# Patient Record
Sex: Male | Born: 1980 | Hispanic: No | Marital: Single | Smoking: Current every day smoker
Health system: Southern US, Community
[De-identification: ages and names within clinical notes are randomized; demographics above are authoritative.]

## PROBLEM LIST (undated history)

## (undated) DIAGNOSIS — E119 Type 2 diabetes mellitus without complications: Secondary | ICD-10-CM

## (undated) DIAGNOSIS — F329 Major depressive disorder, single episode, unspecified: Secondary | ICD-10-CM

## (undated) DIAGNOSIS — M542 Cervicalgia: Secondary | ICD-10-CM

## (undated) DIAGNOSIS — F250 Schizoaffective disorder, bipolar type: Secondary | ICD-10-CM

## (undated) DIAGNOSIS — J449 Chronic obstructive pulmonary disease, unspecified: Secondary | ICD-10-CM

## (undated) DIAGNOSIS — B182 Chronic viral hepatitis C: Secondary | ICD-10-CM

## (undated) DIAGNOSIS — F17200 Nicotine dependence, unspecified, uncomplicated: Secondary | ICD-10-CM

## (undated) DIAGNOSIS — F319 Bipolar disorder, unspecified: Secondary | ICD-10-CM

## (undated) DIAGNOSIS — E785 Hyperlipidemia, unspecified: Secondary | ICD-10-CM

## (undated) DIAGNOSIS — G4730 Sleep apnea, unspecified: Secondary | ICD-10-CM

## (undated) DIAGNOSIS — F259 Schizoaffective disorder, unspecified: Secondary | ICD-10-CM

## (undated) DIAGNOSIS — F32A Depression, unspecified: Secondary | ICD-10-CM

## (undated) DIAGNOSIS — F419 Anxiety disorder, unspecified: Secondary | ICD-10-CM

## (undated) DIAGNOSIS — I517 Cardiomegaly: Secondary | ICD-10-CM

## (undated) HISTORY — DX: Type 2 diabetes mellitus without complications: E11.9

## (undated) HISTORY — DX: Cardiomegaly: I51.7

## (undated) HISTORY — DX: Anxiety disorder, unspecified: F41.9

## (undated) HISTORY — DX: Cervicalgia: M54.2

## (undated) HISTORY — DX: Nicotine dependence, unspecified, uncomplicated: F17.200

## (undated) HISTORY — DX: Sleep apnea, unspecified: G47.30

## (undated) HISTORY — PX: OTHER SURGICAL HISTORY: SHX169

## (undated) HISTORY — DX: Chronic obstructive pulmonary disease, unspecified: J44.9

## (undated) HISTORY — DX: Hyperlipidemia, unspecified: E78.5

## (undated) HISTORY — DX: Chronic viral hepatitis C: B18.2

---

## 2006-07-02 ENCOUNTER — Emergency Department: Payer: Self-pay | Admitting: Emergency Medicine

## 2006-11-06 ENCOUNTER — Emergency Department: Payer: Self-pay | Admitting: Internal Medicine

## 2006-11-06 ENCOUNTER — Inpatient Hospital Stay (HOSPITAL_COMMUNITY): Admission: AD | Admit: 2006-11-06 | Discharge: 2006-11-10 | Payer: Self-pay | Admitting: Psychiatry

## 2006-11-06 ENCOUNTER — Ambulatory Visit: Payer: Self-pay | Admitting: Psychiatry

## 2006-11-17 ENCOUNTER — Ambulatory Visit: Payer: Self-pay | Admitting: Emergency Medicine

## 2007-02-19 ENCOUNTER — Emergency Department: Payer: Self-pay | Admitting: Emergency Medicine

## 2007-04-10 ENCOUNTER — Emergency Department: Payer: Self-pay | Admitting: Emergency Medicine

## 2008-07-03 ENCOUNTER — Inpatient Hospital Stay: Payer: Self-pay | Admitting: Unknown Physician Specialty
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
## 2011-02-15 LAB — URINALYSIS, ROUTINE W REFLEX MICROSCOPIC
Bilirubin Urine: NEGATIVE
Glucose, UA: NEGATIVE
Hgb urine dipstick: NEGATIVE
Protein, ur: NEGATIVE

## 2011-04-18 ENCOUNTER — Emergency Department: Payer: Self-pay | Admitting: Emergency Medicine

## 2011-06-08 LAB — URINALYSIS, COMPLETE
Bilirubin,UR: NEGATIVE
Ketone: NEGATIVE
Leukocyte Esterase: NEGATIVE
Protein: NEGATIVE
RBC,UR: <1 /HPF (ref 0–5)
WBC UR: <1 /HPF (ref 0–5)

## 2011-06-08 LAB — DRUG SCREEN, URINE
Amphetamines, Ur Screen: NEGATIVE (ref ?–1000)
MDMA (Ecstasy)Ur Screen: NEGATIVE (ref ?–500)
Opiate, Ur Screen: NEGATIVE (ref ?–300)
Phencyclidine (PCP) Ur S: NEGATIVE (ref ?–25)

## 2011-06-08 LAB — CBC
MCH: 31.3 pg (ref 26.0–34.0)
MCHC: 33.4 g/dL (ref 32.0–36.0)
Platelet: 342 10*3/uL (ref 150–440)
RBC: 5.1 10*6/uL (ref 4.40–5.90)
RDW: 13.5 % (ref 11.5–14.5)

## 2011-06-08 LAB — COMPREHENSIVE METABOLIC PANEL
Bilirubin,Total: 0.4 mg/dL (ref 0.2–1.0)
Chloride: 105 mmol/L (ref 98–107)
Co2: 26 mmol/L (ref 21–32)
Creatinine: 0.83 mg/dL (ref 0.60–1.30)
EGFR (African American): >60
Glucose: 94 mg/dL (ref 65–99)
Osmolality: 282 (ref 275–301)
Potassium: 4 mmol/L (ref 3.5–5.1)
SGPT (ALT): 66 U/L
Sodium: 141 mmol/L (ref 136–145)
Total Protein: 7.8 g/dL (ref 6.4–8.2)

## 2011-06-08 LAB — ETHANOL
Ethanol %: <0.003 % (ref 0.000–0.080)
Ethanol: <3 mg/dL

## 2011-06-09 ENCOUNTER — Inpatient Hospital Stay: Payer: Self-pay | Admitting: Psychiatry

## 2011-07-12 ENCOUNTER — Inpatient Hospital Stay: Payer: Self-pay | Admitting: Psychiatry

## 2011-07-12 LAB — CBC
Platelet: 287 10*3/uL (ref 150–440)
RBC: 5.15 10*6/uL (ref 4.40–5.90)
WBC: 14.6 10*3/uL — ABNORMAL HIGH (ref 3.8–10.6)

## 2011-07-12 LAB — ACETAMINOPHEN LEVEL: Acetaminophen: <2 ug/mL

## 2011-07-12 LAB — COMPREHENSIVE METABOLIC PANEL
Alkaline Phosphatase: 65 U/L (ref 50–136)
Bilirubin,Total: 0.4 mg/dL (ref 0.2–1.0)
Co2: 25 mmol/L (ref 21–32)
Creatinine: 0.77 mg/dL (ref 0.60–1.30)
EGFR (Non-African Amer.): >60
SGPT (ALT): 75 U/L

## 2011-07-12 LAB — DRUG SCREEN, URINE
Cannabinoid 50 Ng, Ur ~~LOC~~: NEGATIVE (ref ?–50)
MDMA (Ecstasy)Ur Screen: NEGATIVE (ref ?–500)
Phencyclidine (PCP) Ur S: NEGATIVE (ref ?–25)

## 2011-07-13 LAB — BEHAVIORAL MEDICINE 1 PANEL
Albumin: 4 g/dL (ref 3.4–5.0)
Alkaline Phosphatase: 61 U/L (ref 50–136)
Anion Gap: 9 (ref 7–16)
Basophil #: 0 10*3/uL (ref 0.0–0.1)
Basophil %: 0.3 %
Calcium, Total: 9.4 mg/dL (ref 8.5–10.1)
Co2: 26 mmol/L (ref 21–32)
Eosinophil #: 0.3 10*3/uL (ref 0.0–0.7)
Eosinophil %: 1.9 %
Glucose: 133 mg/dL — ABNORMAL HIGH (ref 65–99)
HGB: 16.3 g/dL (ref 13.0–18.0)
Lymphocyte #: 4.8 10*3/uL — ABNORMAL HIGH (ref 1.0–3.6)
MCH: 31.6 pg (ref 26.0–34.0)
MCV: 93 fL (ref 80–100)
Monocyte #: 1 10*3/uL — ABNORMAL HIGH (ref 0.0–0.7)
Neutrophil %: 58.8 %
Osmolality: 286 (ref 275–301)
RBC: 5.14 10*6/uL (ref 4.40–5.90)
SGOT(AST): 34 U/L (ref 15–37)
Thyroid Stimulating Horm: 1.82 u[IU]/mL

## 2011-07-13 LAB — URINALYSIS, COMPLETE
Bilirubin,UR: NEGATIVE
Ketone: NEGATIVE
Nitrite: NEGATIVE
Ph: 5 (ref 4.5–8.0)
Protein: NEGATIVE
WBC UR: <1 /HPF (ref 0–5)

## 2011-07-14 LAB — CBC WITH DIFFERENTIAL/PLATELET
Basophil %: 0.4 %
HCT: 45.7 % (ref 40.0–52.0)
Lymphocyte %: 34.2 %
Monocyte %: 6.8 %
Neutrophil #: 6.9 10*3/uL — ABNORMAL HIGH (ref 1.4–6.5)
RBC: 4.89 10*6/uL (ref 4.40–5.90)

## 2011-11-02 LAB — URINALYSIS, COMPLETE
Bilirubin,UR: NEGATIVE
Blood: NEGATIVE
Ketone: NEGATIVE
Ph: 5 (ref 4.5–8.0)
Protein: 30

## 2011-11-02 LAB — COMPREHENSIVE METABOLIC PANEL
BUN: 18 mg/dL (ref 7–18)
Bilirubin,Total: 0.3 mg/dL (ref 0.2–1.0)
Calcium, Total: 9.1 mg/dL (ref 8.5–10.1)
Chloride: 108 mmol/L — ABNORMAL HIGH (ref 98–107)
EGFR (Non-African Amer.): >60
Osmolality: 286 (ref 275–301)
Potassium: 3.8 mmol/L (ref 3.5–5.1)
SGOT(AST): 23 U/L (ref 15–37)
SGPT (ALT): 50 U/L

## 2011-11-02 LAB — CBC
HCT: 44.4 % (ref 40.0–52.0)
HGB: 14.2 g/dL (ref 13.0–18.0)
MCH: 29.8 pg (ref 26.0–34.0)
MCHC: 31.9 g/dL — ABNORMAL LOW (ref 32.0–36.0)
MCV: 93 fL (ref 80–100)
RBC: 4.75 10*6/uL (ref 4.40–5.90)
WBC: 13.1 10*3/uL — ABNORMAL HIGH (ref 3.8–10.6)

## 2011-11-02 LAB — DRUG SCREEN, URINE
Benzodiazepine, Ur Scrn: NEGATIVE (ref ?–200)
Cannabinoid 50 Ng, Ur ~~LOC~~: POSITIVE (ref ?–50)
Methadone, Ur Screen: NEGATIVE (ref ?–300)
Phencyclidine (PCP) Ur S: NEGATIVE (ref ?–25)

## 2011-11-02 LAB — TSH: Thyroid Stimulating Horm: 0.793 u[IU]/mL

## 2011-11-03 ENCOUNTER — Inpatient Hospital Stay: Payer: Self-pay | Admitting: Psychiatry

## 2011-11-04 LAB — LIPID PANEL
HDL Cholesterol: 34 mg/dL — ABNORMAL LOW (ref 40–60)
Ldl Cholesterol, Calc: 91 mg/dL (ref 0–100)
VLDL Cholesterol, Calc: 50 mg/dL — ABNORMAL HIGH (ref 5–40)

## 2011-11-14 LAB — COMPREHENSIVE METABOLIC PANEL
Anion Gap: 11 (ref 7–16)
BUN: 11 mg/dL (ref 7–18)
Calcium, Total: 9 mg/dL (ref 8.5–10.1)
Chloride: 103 mmol/L (ref 98–107)
EGFR (African American): >60
Potassium: 4.3 mmol/L (ref 3.5–5.1)
SGOT(AST): 25 U/L (ref 15–37)
Total Protein: 6.9 g/dL (ref 6.4–8.2)

## 2011-11-19 LAB — CBC WITH DIFFERENTIAL/PLATELET
Basophil %: 0.4 %
Eosinophil #: 0.3 10*3/uL (ref 0.0–0.7)
Eosinophil %: 2.3 %
HGB: 15.1 g/dL (ref 13.0–18.0)
Lymphocyte #: 3.1 10*3/uL (ref 1.0–3.6)
MCH: 30.3 pg (ref 26.0–34.0)
MCHC: 32 g/dL (ref 32.0–36.0)
MCV: 95 fL (ref 80–100)
Monocyte #: 1 x10 3/mm (ref 0.2–1.0)
Platelet: 279 10*3/uL (ref 150–440)
RBC: 5 10*6/uL (ref 4.40–5.90)
WBC: 12.3 10*3/uL — ABNORMAL HIGH (ref 3.8–10.6)

## 2011-11-22 LAB — WBC: WBC: 10.9 10*3/uL — ABNORMAL HIGH (ref 3.8–10.6)

## 2012-08-16 ENCOUNTER — Emergency Department: Payer: Self-pay | Admitting: Emergency Medicine

## 2012-08-16 LAB — URINALYSIS, COMPLETE
Glucose,UR: NEGATIVE mg/dL (ref 0–75)
Leukocyte Esterase: NEGATIVE
Ph: 5 (ref 4.5–8.0)
Protein: NEGATIVE
RBC,UR: 1 /HPF (ref 0–5)
Specific Gravity: 1.019 (ref 1.003–1.030)
WBC UR: 3 /HPF (ref 0–5)

## 2012-08-16 LAB — DRUG SCREEN, URINE
Amphetamines, Ur Screen: NEGATIVE (ref ?–1000)
Barbiturates, Ur Screen: NEGATIVE (ref ?–200)
Benzodiazepine, Ur Scrn: NEGATIVE (ref ?–200)
Cannabinoid 50 Ng, Ur ~~LOC~~: NEGATIVE (ref ?–50)
Methadone, Ur Screen: NEGATIVE (ref ?–300)
Opiate, Ur Screen: NEGATIVE (ref ?–300)
Phencyclidine (PCP) Ur S: NEGATIVE (ref ?–25)

## 2012-08-16 LAB — COMPREHENSIVE METABOLIC PANEL
Bilirubin,Total: 0.3 mg/dL (ref 0.2–1.0)
Chloride: 107 mmol/L (ref 98–107)
Co2: 29 mmol/L (ref 21–32)
Creatinine: 1.05 mg/dL (ref 0.60–1.30)
EGFR (Non-African Amer.): >60
Osmolality: 281 (ref 275–301)
SGOT(AST): 62 U/L — ABNORMAL HIGH (ref 15–37)
SGPT (ALT): 117 U/L — ABNORMAL HIGH (ref 12–78)
Total Protein: 7.5 g/dL (ref 6.4–8.2)

## 2012-08-16 LAB — ETHANOL: Ethanol: <3 mg/dL

## 2012-08-16 LAB — CBC
HCT: 46.6 % (ref 40.0–52.0)
HGB: 15.7 g/dL (ref 13.0–18.0)
MCHC: 33.6 g/dL (ref 32.0–36.0)
RBC: 5.05 10*6/uL (ref 4.40–5.90)

## 2012-08-16 LAB — LITHIUM LEVEL: Lithium: <0.2 mmol/L — ABNORMAL LOW

## 2013-09-16 ENCOUNTER — Emergency Department: Payer: Self-pay | Admitting: Emergency Medicine

## 2013-09-16 LAB — ETHANOL: Ethanol: <3 mg/dL

## 2013-09-16 LAB — COMPREHENSIVE METABOLIC PANEL
ALT: 355 U/L — AB (ref 12–78)
ANION GAP: 6 — AB (ref 7–16)
AST: 131 U/L — AB (ref 15–37)
Albumin: 4.1 g/dL (ref 3.4–5.0)
Alkaline Phosphatase: 58 U/L
BILIRUBIN TOTAL: 0.4 mg/dL (ref 0.2–1.0)
BUN: 15 mg/dL (ref 7–18)
CALCIUM: 9 mg/dL (ref 8.5–10.1)
CO2: 24 mmol/L (ref 21–32)
Chloride: 107 mmol/L (ref 98–107)
Creatinine: 1.18 mg/dL (ref 0.60–1.30)
EGFR (African American): >60
EGFR (Non-African Amer.): >60
GLUCOSE: 234 mg/dL — AB (ref 65–99)
OSMOLALITY: 282 (ref 275–301)
POTASSIUM: 4.1 mmol/L (ref 3.5–5.1)
SODIUM: 137 mmol/L (ref 136–145)
TOTAL PROTEIN: 7.4 g/dL (ref 6.4–8.2)

## 2013-09-16 LAB — DRUG SCREEN, URINE
Amphetamines, Ur Screen: NEGATIVE (ref ?–1000)
BARBITURATES, UR SCREEN: NEGATIVE (ref ?–200)
BENZODIAZEPINE, UR SCRN: POSITIVE (ref ?–200)
Cannabinoid 50 Ng, Ur ~~LOC~~: NEGATIVE (ref ?–50)
Cocaine Metabolite,Ur ~~LOC~~: NEGATIVE (ref ?–300)
MDMA (ECSTASY) UR SCREEN: NEGATIVE (ref ?–500)
Methadone, Ur Screen: NEGATIVE (ref ?–300)
Opiate, Ur Screen: NEGATIVE (ref ?–300)
PHENCYCLIDINE (PCP) UR S: NEGATIVE (ref ?–25)
Tricyclic, Ur Screen: POSITIVE (ref ?–1000)

## 2013-09-16 LAB — URINALYSIS, COMPLETE
BACTERIA: NONE SEEN
BILIRUBIN, UR: NEGATIVE
Blood: NEGATIVE
Glucose,UR: 150 mg/dL (ref 0–75)
Ketone: NEGATIVE
Leukocyte Esterase: NEGATIVE
Nitrite: NEGATIVE
Ph: 6 (ref 4.5–8.0)
Protein: NEGATIVE
RBC, UR: NONE SEEN /HPF (ref 0–5)
Specific Gravity: 1.012 (ref 1.003–1.030)
Squamous Epithelial: <1
WBC UR: NONE SEEN /HPF (ref 0–5)

## 2013-09-16 LAB — LITHIUM LEVEL: Lithium: 0.61 mmol/L

## 2013-09-16 LAB — CBC
HCT: 48.7 % (ref 40.0–52.0)
HGB: 16 g/dL (ref 13.0–18.0)
MCH: 31.4 pg (ref 26.0–34.0)
MCHC: 32.9 g/dL (ref 32.0–36.0)
MCV: 96 fL (ref 80–100)
Platelet: 258 10*3/uL (ref 150–440)
RBC: 5.1 10*6/uL (ref 4.40–5.90)
RDW: 13.5 % (ref 11.5–14.5)
WBC: 13.7 10*3/uL — ABNORMAL HIGH (ref 3.8–10.6)

## 2013-09-16 LAB — SALICYLATE LEVEL: Salicylates, Serum: 3.4 mg/dL — ABNORMAL HIGH

## 2013-09-16 LAB — ACETAMINOPHEN LEVEL

## 2013-12-12 ENCOUNTER — Emergency Department: Payer: Self-pay | Admitting: Emergency Medicine

## 2013-12-12 LAB — CBC
HCT: 47.9 % (ref 40.0–52.0)
HGB: 15.5 g/dL (ref 13.0–18.0)
MCH: 31.1 pg (ref 26.0–34.0)
MCHC: 32.4 g/dL (ref 32.0–36.0)
MCV: 96 fL (ref 80–100)
Platelet: 278 10*3/uL (ref 150–440)
RBC: 4.99 10*6/uL (ref 4.40–5.90)
RDW: 13.7 % (ref 11.5–14.5)
WBC: 12.3 10*3/uL — AB (ref 3.8–10.6)

## 2013-12-12 LAB — COMPREHENSIVE METABOLIC PANEL
ALBUMIN: 3.7 g/dL (ref 3.4–5.0)
ALK PHOS: 58 U/L
ALT: 344 U/L — AB
Anion Gap: 5 — ABNORMAL LOW (ref 7–16)
BUN: 14 mg/dL (ref 7–18)
Bilirubin,Total: 0.5 mg/dL (ref 0.2–1.0)
CALCIUM: 8.7 mg/dL (ref 8.5–10.1)
Chloride: 110 mmol/L — ABNORMAL HIGH (ref 98–107)
Co2: 24 mmol/L (ref 21–32)
Creatinine: 1.01 mg/dL (ref 0.60–1.30)
EGFR (African American): >60
GLUCOSE: 129 mg/dL — AB (ref 65–99)
Osmolality: 280 (ref 275–301)
Potassium: 4.1 mmol/L (ref 3.5–5.1)
SGOT(AST): 116 U/L — ABNORMAL HIGH (ref 15–37)
Sodium: 139 mmol/L (ref 136–145)
Total Protein: 7.5 g/dL (ref 6.4–8.2)

## 2013-12-12 LAB — DRUG SCREEN, URINE
Amphetamines, Ur Screen: NEGATIVE (ref ?–1000)
BARBITURATES, UR SCREEN: NEGATIVE (ref ?–200)
BENZODIAZEPINE, UR SCRN: POSITIVE (ref ?–200)
Cannabinoid 50 Ng, Ur ~~LOC~~: NEGATIVE (ref ?–50)
Cocaine Metabolite,Ur ~~LOC~~: NEGATIVE (ref ?–300)
MDMA (ECSTASY) UR SCREEN: NEGATIVE (ref ?–500)
METHADONE, UR SCREEN: NEGATIVE (ref ?–300)
Opiate, Ur Screen: NEGATIVE (ref ?–300)
PHENCYCLIDINE (PCP) UR S: NEGATIVE (ref ?–25)
Tricyclic, Ur Screen: NEGATIVE (ref ?–1000)

## 2013-12-12 LAB — ETHANOL
Ethanol %: <0.003 % (ref 0.000–0.080)
Ethanol: <3 mg/dL

## 2013-12-12 LAB — TSH: THYROID STIMULATING HORM: 0.65 u[IU]/mL

## 2013-12-12 LAB — SALICYLATE LEVEL: SALICYLATES, SERUM: 3.2 mg/dL — AB

## 2013-12-12 LAB — ACETAMINOPHEN LEVEL: Acetaminophen: <2 ug/mL

## 2013-12-12 LAB — LITHIUM LEVEL: LITHIUM: 0.48 mmol/L — AB

## 2014-05-01 ENCOUNTER — Emergency Department: Payer: Self-pay | Admitting: Emergency Medicine

## 2014-05-01 LAB — CBC
HCT: 44.7 % (ref 40.0–52.0)
HGB: 14.9 g/dL (ref 13.0–18.0)
MCH: 31.2 pg (ref 26.0–34.0)
MCHC: 33.3 g/dL (ref 32.0–36.0)
MCV: 94 fL (ref 80–100)
Platelet: 287 10*3/uL (ref 150–440)
RBC: 4.77 10*6/uL (ref 4.40–5.90)
RDW: 13.7 % (ref 11.5–14.5)
WBC: 15 10*3/uL — AB (ref 3.8–10.6)

## 2014-05-01 LAB — COMPREHENSIVE METABOLIC PANEL
ALBUMIN: 4 g/dL (ref 3.4–5.0)
ALK PHOS: 61 U/L
AST: 93 U/L — AB (ref 15–37)
Anion Gap: 7 (ref 7–16)
BUN: 14 mg/dL (ref 7–18)
Bilirubin,Total: 0.3 mg/dL (ref 0.2–1.0)
CO2: 26 mmol/L (ref 21–32)
CREATININE: 1.08 mg/dL (ref 0.60–1.30)
Calcium, Total: 9 mg/dL (ref 8.5–10.1)
Chloride: 110 mmol/L — ABNORMAL HIGH (ref 98–107)
EGFR (Non-African Amer.): >60
Glucose: 153 mg/dL — ABNORMAL HIGH (ref 65–99)
Osmolality: 288 (ref 275–301)
Potassium: 3.9 mmol/L (ref 3.5–5.1)
SGPT (ALT): 230 U/L — ABNORMAL HIGH
SODIUM: 143 mmol/L (ref 136–145)
TOTAL PROTEIN: 7.2 g/dL (ref 6.4–8.2)

## 2014-05-01 LAB — LITHIUM LEVEL: Lithium: 0.4 mmol/L — ABNORMAL LOW

## 2014-05-01 LAB — ETHANOL: Ethanol: <3 mg/dL

## 2014-05-01 LAB — SALICYLATE LEVEL: Salicylates, Serum: 2.3 mg/dL

## 2014-05-01 LAB — ACETAMINOPHEN LEVEL: Acetaminophen: <2 ug/mL

## 2014-05-02 LAB — URINALYSIS, COMPLETE
BILIRUBIN, UR: NEGATIVE
BLOOD: NEGATIVE
Bacteria: NONE SEEN
Glucose,UR: NEGATIVE mg/dL (ref 0–75)
Ketone: NEGATIVE
LEUKOCYTE ESTERASE: NEGATIVE
Nitrite: NEGATIVE
Ph: 7 (ref 4.5–8.0)
Protein: NEGATIVE
RBC,UR: NONE SEEN /HPF (ref 0–5)
SQUAMOUS EPITHELIAL: NONE SEEN
Specific Gravity: 1.014 (ref 1.003–1.030)
WBC UR: <1 /HPF (ref 0–5)

## 2014-05-02 LAB — DRUG SCREEN, URINE
Amphetamines, Ur Screen: NEGATIVE (ref ?–1000)
Barbiturates, Ur Screen: NEGATIVE (ref ?–200)
Benzodiazepine, Ur Scrn: POSITIVE (ref ?–200)
CANNABINOID 50 NG, UR ~~LOC~~: NEGATIVE (ref ?–50)
Cocaine Metabolite,Ur ~~LOC~~: NEGATIVE (ref ?–300)
MDMA (ECSTASY) UR SCREEN: NEGATIVE (ref ?–500)
Methadone, Ur Screen: NEGATIVE (ref ?–300)
OPIATE, UR SCREEN: NEGATIVE (ref ?–300)
PHENCYCLIDINE (PCP) UR S: NEGATIVE (ref ?–25)
Tricyclic, Ur Screen: NEGATIVE (ref ?–1000)

## 2014-08-19 NOTE — Consult Note (Signed)
Psychological Assessment  Roy MulliganJoel Chandler31of Evaluation: 7-30-12Administered: [name] Gestalt  Trail Making Test Parts A & B for Referral: [name] was referred for a psychological assessment by his physician, Roy SectionAarti Kapur, MD.  He was admitted to Behavioral Medicine for the treatment paranoid and delusional thoughts with auditory hallucinations.  He has been diagnosed with paranoid schizophrenia in the past. Please see the history and physical and psychosocial history for further background information. A neuro-psychological screening was requested.  [name] was pleasant and cooperative with the testing process. He attempted all tests requested of him. He appeared to try his best. The present evaluation is considered a valid indication of current functioning. of TestingMaking Test: On Part A [name] required 50 seconds to complete the task. Expected performance is 27 to 39 seconds. His performance reflects mild to moderate impairment. On Part B he required 120 seconds. Expected performance is 66 to 85 seconds. His performance reflects mild to moderate impairment. Gestalt: [name] obtained two errors on his drawings for the Bender Gestalt ? perseveration and angulation. His performance falls in the expected range for adults.  Impression: [name] showed more difficulty on timed than untimed tests. His performance suggests no evidence to mild evidence for brain impairment.    Electronic Signatures: [name], [name] (PsyD, HSP-P)  (Signed on 30-Jul-13 10:09)  Authored  Last Updated: 30-Jul-13 10:09 by [name], [name] (PsyD, HSP-P)

## 2014-08-20 LAB — URINALYSIS, COMPLETE
BILIRUBIN, UR: NEGATIVE
Bacteria: NONE SEEN
Blood: NEGATIVE
Glucose,UR: NEGATIVE mg/dL (ref 0–75)
KETONE: NEGATIVE
LEUKOCYTE ESTERASE: NEGATIVE
NITRITE: NEGATIVE
PROTEIN: NEGATIVE
Ph: 6 (ref 4.5–8.0)
SPECIFIC GRAVITY: 1.012 (ref 1.003–1.030)
Squamous Epithelial: NONE SEEN

## 2014-08-20 LAB — COMPREHENSIVE METABOLIC PANEL
ALBUMIN: 4.6 g/dL
ALT: 152 U/L — AB
ANION GAP: 4 — AB (ref 7–16)
Alkaline Phosphatase: 56 U/L
BILIRUBIN TOTAL: 0.4 mg/dL
BUN: 22 mg/dL — ABNORMAL HIGH
CHLORIDE: 107 mmol/L
Calcium, Total: 9.4 mg/dL
Co2: 30 mmol/L
Creatinine: 0.95 mg/dL
EGFR (African American): >60
EGFR (Non-African Amer.): >60
GLUCOSE: 110 mg/dL — AB
Potassium: 4.5 mmol/L
SGOT(AST): 67 U/L — ABNORMAL HIGH
Sodium: 141 mmol/L
TOTAL PROTEIN: 7.3 g/dL

## 2014-08-20 LAB — DRUG SCREEN, URINE
Amphetamines, Ur Screen: NEGATIVE
BARBITURATES, UR SCREEN: NEGATIVE
BENZODIAZEPINE, UR SCRN: POSITIVE
CANNABINOID 50 NG, UR ~~LOC~~: NEGATIVE
Cocaine Metabolite,Ur ~~LOC~~: NEGATIVE
MDMA (Ecstasy)Ur Screen: NEGATIVE
METHADONE, UR SCREEN: NEGATIVE
OPIATE, UR SCREEN: NEGATIVE
PHENCYCLIDINE (PCP) UR S: NEGATIVE
Tricyclic, Ur Screen: POSITIVE

## 2014-08-20 LAB — CBC
HCT: 44.5 % (ref 40.0–52.0)
HGB: 14.6 g/dL (ref 13.0–18.0)
MCH: 30.5 pg (ref 26.0–34.0)
MCHC: 32.8 g/dL (ref 32.0–36.0)
MCV: 93 fL (ref 80–100)
PLATELETS: 251 10*3/uL (ref 150–440)
RBC: 4.79 10*6/uL (ref 4.40–5.90)
RDW: 14.3 % (ref 11.5–14.5)
WBC: 15.7 10*3/uL — AB (ref 3.8–10.6)

## 2014-08-20 LAB — LITHIUM LEVEL: LITHIUM: 0.99 mmol/L (ref 0.60–1.20)

## 2014-08-20 LAB — ETHANOL: Ethanol: <5 mg/dL

## 2014-08-20 LAB — ACETAMINOPHEN LEVEL: Acetaminophen: <10 ug/mL

## 2014-08-20 LAB — SALICYLATE LEVEL: Salicylates, Serum: <4 mg/dL

## 2014-08-21 LAB — DIFFERENTIAL
BASOS ABS: 0.1 10*3/uL (ref 0.0–0.1)
BASOS PCT: 0.7 %
EOS PCT: 4.5 %
Eosinophil #: 0.7 10*3/uL (ref 0.0–0.7)
LYMPHS PCT: 16.1 %
Lymphocyte #: 2.5 10*3/uL (ref 1.0–3.6)
Monocyte #: 1 x10 3/mm (ref 0.2–1.0)
Monocyte %: 6.2 %
NEUTROS ABS: 11.4 10*3/uL — AB (ref 1.4–6.5)
Neutrophil %: 72.5 %

## 2014-08-22 ENCOUNTER — Inpatient Hospital Stay
Admission: AD | Admit: 2014-08-22 | Discharge: 2014-09-08 | DRG: 885 | Disposition: A | Discharge status: Home / Self Care | Payer: Medicaid Other | Attending: Psychiatry | Admitting: Psychiatry

## 2014-08-22 DIAGNOSIS — B1920 Unspecified viral hepatitis C without hepatic coma: Secondary | ICD-10-CM | POA: Diagnosis present

## 2014-08-22 DIAGNOSIS — R000 Tachycardia, unspecified: Secondary | ICD-10-CM | POA: Diagnosis present

## 2014-08-22 DIAGNOSIS — Z9889 Other specified postprocedural states: Secondary | ICD-10-CM | POA: Diagnosis not present

## 2014-08-22 DIAGNOSIS — Z6830 Body mass index (BMI) 30.0-30.9, adult: Secondary | ICD-10-CM | POA: Diagnosis not present

## 2014-08-22 DIAGNOSIS — F1221 Cannabis dependence, in remission: Secondary | ICD-10-CM | POA: Diagnosis present

## 2014-08-22 DIAGNOSIS — I10 Essential (primary) hypertension: Secondary | ICD-10-CM | POA: Diagnosis present

## 2014-08-22 DIAGNOSIS — F22 Delusional disorders: Secondary | ICD-10-CM | POA: Diagnosis present

## 2014-08-22 DIAGNOSIS — E669 Obesity, unspecified: Secondary | ICD-10-CM | POA: Diagnosis present

## 2014-08-22 DIAGNOSIS — F17200 Nicotine dependence, unspecified, uncomplicated: Secondary | ICD-10-CM | POA: Diagnosis present

## 2014-08-22 DIAGNOSIS — F250 Schizoaffective disorder, bipolar type: Secondary | ICD-10-CM | POA: Diagnosis not present

## 2014-08-22 DIAGNOSIS — J449 Chronic obstructive pulmonary disease, unspecified: Secondary | ICD-10-CM | POA: Diagnosis present

## 2014-08-22 DIAGNOSIS — K5900 Constipation, unspecified: Secondary | ICD-10-CM | POA: Diagnosis present

## 2014-08-22 NOTE — Consult Note (Signed)
Brief Consult Note: Diagnosis: Schizophrenia, Paranoid Type.   Patient was seen by consultant.   Recommend further assessment or treatment.   Orders entered.   Comments: Pt seen in ED BHU. He remains agitated and uncooperative. He stated that he does not need Haldol and does not want to go another facility. He has just received the Abilify Maintena injec tion and has not shown any improvement. He has no insight into his illness.Remains a risk to himself and others.   Plan:  Will titrate  Haldol 10 mg po BID and Cogentin 1 mg po BID  Continue Lithium 300mg  po qam and 600mg  po qhs.  Change Lorazepam 0.5 po qhs.  [facility].  Electronic Signatures: Rhunette CroftFaheem, Welles Walthall S (MD)  (Signed 26-Apr-14 10:58)  Authored: Brief Consult Note   Last Updated: 26-Apr-14 10:58 by Rhunette CroftFaheem, Rylan Bernard S (MD)

## 2014-08-22 NOTE — Consult Note (Signed)
Brief Consult Note: Diagnosis: Schizophrenia, Paranoid Type.   Patient was seen by consultant.   Recommend further assessment or treatment.   Orders entered.   Comments: Pt seen in ED BHU. He remains very agitated and uncooperative. Stated that he has "money" and can find his own place and live on his own. He was not willing to provide information about his medications. His MAR indicated that he is on Abilify Maintenna 300mg  IM qmonthly, but is not known when he received the last injection. Remains uncooperative with the staff as well.   Plan:  Will add Haldol 5mg  po BID Start Lithium 300mg  po qam and 600mg  po qhs.  Change Lorazepam 0.5 po qhs.  Will obtain Collateral info about his group home and medications.  Electronic Signatures: Rhunette CroftFaheem, Maximillion Gill S (MD)  (Signed 22-Apr-14 10:49)  Authored: Brief Consult Note   Last Updated: 22-Apr-14 10:49 by Rhunette CroftFaheem, Holger Sokolowski S (MD)
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
## 2014-08-22 NOTE — Consult Note (Signed)
Brief Consult Note: Diagnosis: Schizophrenia, Paranoid Type.   Patient was seen by consultant.   Recommend further assessment or treatment.   Orders entered.   Comments: Pt seen in ED BHU. He appeared more calm and cooperative and has started showing on his medications. He stated that he is willing to go back to the Group Home, where he came from as he does not have any acute issues now. he stated that he is complaint with meds and he was given Abilify Maintenna in the Ed BHU. He was given the injection here and will get the next dose at his outpt appointment with Dr Janeece RiggersSu.  Pt denied Si/HI or plans.   Plan:  Continue  Haldol 10 mg po BID and Cogentin 1 mg po BID  Continue Lithium 300mg  po qam and 600mg  po qhs.  Change Lorazepam 0.5 po qhs.  Will release him from IVC  D/C to Group Home in stable condition.  Electronic Signatures: Rhunette CroftFaheem, Alexias Margerum S (MD)  (Signed 29-Apr-14 10:19)  Authored: Brief Consult Note   Last Updated: 29-Apr-14 10:19 by Rhunette CroftFaheem, Maelee Hoot S (MD)
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
## 2014-08-23 NOTE — Consult Note (Signed)
PATIENT NAME:  [name], [name] MR#:  [id number] DATE OF BIRTH:  [birth year]  DATE OF CONSULTATION:  12/13/2013  REFERRING PHYSICIAN:   CONSULTING PHYSICIAN:  Audery AmelJohn T. Clapacs, MD  IDENTIFYING INFORMATION AND REASON FOR CONSULT: This is a 34 year old man with a history of schizophrenia, brought in voluntarily from his group home. His chief complaint to me was "Apolinar JunesBrandon would not let me use the phone."   HISTORY OF PRESENT ILLNESS: Information obtained from the patient and the chart. The patient was brought here by his group home last night. He told the intake nurse that he needed to be on Ritalin. He complained of having his hallucinations, which he said had been going on for years. There was some vague secondhand statement that he had made a comment about hurting himself. On interview today, the patient tells me that he was being driven to [facility] for his shot yesterday when he tried to get his group home leader to give him the telephone. When the group home leader would not do it, apparently there was some kind of argument and he was brought to the hospital. There is no indication that he has actually engaged in any acutely dangerous behavior. The patient says he has been compliant with his medicines. He says that he has auditory hallucinations about 75% of the time and that they have been chronic. Not necessarily better or worse than always. Mood is okay. No suicidal or homicidal ideation. No acute substance abuse.   PAST PSYCHIATRIC HISTORY: The patient has schizophrenia with symptoms that have been present for about 8 years or so. He has had several hospitalizations in the past. He also has a history of substance abuse. He had finally gotten fairly stable on medication and is living in a group home. I do not believe there is an actual history of any suicide attempts. He has been hostile and aggressive at times when he was psychotic.   PAST MEDICAL HISTORY: COPD, apparently. Otherwise, no significant ongoing  medical problems.   FAMILY HISTORY: None.   SOCIAL HISTORY: According to the patient, he is his own guardian. He is living in a group home. Gets disability. 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   CURRENT MEDICATIONS: Clorazepate 7.5 mg twice a day, Cogentin 0.5 mg twice a day, Concerta 27 mg once a morning, Spiriva 18 mcg inhaled once a day, Lodine 400 mg twice a day, Flexeril 10 mg twice a day, Qvar inhaler 2 puffs twice a day, Abilify Maintena injection 300 mg once a month, Ativan 1 mg at night, lithium 450 mg at night, ProAir inhaler 2 puffs 4 times a day.   ALLERGIES: NO KNOWN DRUG ALLERGIES.   REVIEW OF SYSTEMS: Auditory hallucinations. Mood is stable. No suicidal or homicidal ideation. No other physical complaints. Generally negative review of systems.   LABORATORY RESULTS: He was tachycardic, but otherwise, his EKG was normal. Lithium level 0.48. Drug screen positive for benzodiazepines. TSH normal. Salicylates unremarkable. CBC slightly elevated. White count of 12.3. Alcohol negative. Chemistry panel: No significant abnormalities except for an ALT elevated at 344. AST elevated at 116.  I am not sure what that is from.   PHYSICAL EXAMINATION: Full physical not done. The patient does not appear to be in any acute distress. Can move all his extremities. Gait is normal. Face is normal.  No skin lesions identified.  Temperature 98.2, pulse 98, respirations 20, blood pressure 126/69.   MENTAL STATUS EXAMINATION: Slightly disheveled gentleman  who looks his stated age or younger, cooperative with the interview. Eye contact good. Psychomotor activity normal. Speech normal rate, tone and volume. Affect euthymic, reactive. Mood stated as okay. Thoughts are lucid. No evidence of delusions. Reports auditory hallucinations. Seems at times to perhaps be a little distracted. No visual hallucinations. No suicidal or homicidal ideation. Alert and oriented x 4. Can recall 2 out of 3 objects at 2  minutes. Long-term memory grossly intact. Normal intelligence.   ASSESSMENT: A 34 year old man with schizophrenia, got into some kind of his squabble with his group home. No indication here of actual dangerousness. He is reporting hallucinations, but they are stable and chronic. He is lucid in his conversation. There is no indication here for hospital treatment or any change to medication.   TREATMENT PLAN: He is not under involuntary commitment. Does not require hospitalization. No need for change in medication. Medications reviewed and appear to be appropriate. The patient counseled to follow up with his outpatient psychiatrist for further treatment.   DIAGNOSIS, PRINCIPAL AND PRIMARY:  AXIS I: Schizophrenia.   SECONDARY DIAGNOSES:  AXIS I: No further.  AXIS II: No diagnosis.  AXIS III: Chronic obstructive pulmonary disease,  AXIS IV: Moderate, chronic.  AXIS V: Functioning at time of evaluation 55.    ____________________________ Audery Amel, MD jtc:TT D: 12/13/2013 14:03:09 ET T: 12/13/2013 14:30:15 ET JOB#: [id number]  cc: Audery Amel, MD, <Dictator> Audery Amel MD ELECTRONICALLY SIGNED 01/10/2014 16:56

## 2014-08-23 NOTE — Consult Note (Signed)
Brief Consult Note: Diagnosis: Schizophrenia, Paranoid Type.   Patient was seen by consultant.   Consult note dictated.   Recommend further assessment or treatment.   Comments: [name] has a h/o psychosis. He has been stable on medications prescribed by Dr. Janeece RiggersSu. His voices became louder in the pst several days in spite of medic ation compliance. He is not suicidal or homicidal.    PLAN: 1. The patient does not meet criteria for IVC. Please discharge as appropriate.  2. He is to continue all his medications as prescribed by Dr. Janeece RiggersSu.  3. Will increase Tranxene to 7.5 mg twice daily. Rx given.  4. He will follow up with Dr. Janeece RiggersSu.   5. Group home will pick him up.  Electronic Signatures: Kristine LineaPucilowska, Jolanta (MD)  (Signed 18-May-15 17:08)  Authored: Brief Consult Note   Last Updated: 18-May-15 17:08 by Kristine LineaPucilowska, Jolanta (MD)

## 2014-08-23 NOTE — Consult Note (Signed)
PATIENT NAME:  [name], [name] MR#:  [id number] DATE OF BIRTH:  1980-10-19  DATE OF ADMISSION:  09/16/2013 DATE OF CONSULTATION:  09/16/2013  REFERRING PHYSICIAN:  Dr. Jene Everyobert Kinner. CONSULTING PHYSICIAN:  Rodert Hinch B. Delvis Kau, MD  REASON FOR CONSULTATION:  To evaluate a psychotic patient.   IDENTIFYING DATA:  [name] is a 34 year old male with history of schizophrenia.   CHIEF COMPLAINT:  "My voice is amplified."   HISTORY OF PRESENT ILLNESS:  [name] has a long history of psychosis. He continues to have auditory hallucinations daily in spite of good medication compliance. He has been a patient of Dr. Janeece RiggersSu and has been taking lithium, Haldol and Abilify Maintena injections. He has been doing very well recently. He has been living in the same group home for 2 years and doing fine. He is compliant with his medications and doctor's appointments.  There are no substances involved. He goes to [facility] where he has a girlfriend. He has an interview at the vocational rehab. For the past few days, he has been complaining of hallucinations that are louder than ever. The voice of his "friend" returned with power and the voice was arguing with the patient, threatening to wake him up in the middle of the night, making him eat more chips so he will gain weight and kill him. Usually, the patient uses music as a distraction, but lately it has not been possible any longer. On the day of admission, he reports loud voice of this "friend."  He believes that he has a plate implanted in the left side of his head where he is receiving messages from [employer].  He does not appear to excessively anxious about the voices or frightened. He talks about it Medical sales representativematter-of-factly. He is able to name his medications. He is picky about medicines and there are several of them that he refuses to take. His lithium level is 0.5, and the patient believes that it is just right. He does not like it higher because it makes him shake.  He does not believe that he has schizoaffective disorder, and so does not consider lithium an important part of the treatment. He denies any symptoms of depression, anxiety. No symptoms suggestive of bipolar mania.  No substance use reported.   PAST PSYCHIATRIC HISTORY: [facility], [facility], [facility] and [facility].  He is seeing Dr. Janeece RiggersSu for outpatient psychiatry. He denies ever attempting suicide.   FAMILY PSYCHIATRIC HISTORY:  None reported.   PAST MEDICAL HISTORY:  Obesity.   ALLERGIES:  No known drug allergies.   MEDICATIONS ON ADMISSION:  Propranolol 10 mg twice daily, Flovent twice daily, Lodine 400 mg twice daily, Flexeril 10 mg twice daily, Cogentin 0.5 mg twice daily, Haldol 10 mg twice daily, Abilify Maintena 400 mg IM injection last given on May 8th, Tranxene 7.5 mg at bedtime, lithium carbonate 600 mg at bedtime, Ultram 50 mg twice daily as needed, ProAir inhaler as needed.   SOCIAL HISTORY:  The patient has been a resident of the group home for the past 2 years; he likes it there.  They take good care of him. He participates in many activities including [facility]. He also goes to [facility] for some substance use counseling even though he denies using substances. He works with [employer] [facility]. He goes to day program at [facility] daily. We contacted them. Nobody at the [facility] or his mother was aware of any additional stressors.  He is disabled and has health insurance. His parents live in the area and they are supportive. He grew up in [place] area, graduated from high school.   REVIEW OF SYSTEMS:  CONSTITUTIONAL: No fevers or chills. Positive for gradual weight gain.  EYES: No double or blurred vision.  ENT: No hearing losses.  RESPIRATORY:  No shortness of breath or cough.  CARDIOVASCULAR: No chest pain or orthopnea.  GASTROINTESTINAL: No abdominal pain, nausea, vomiting or diarrhea.   GENITOURINARY: No incontinence or frequency.  ENDOCRINE: No heat or cold intolerance.  LYMPHATIC: No anemia or easy bruising.  INTEGUMENTARY: No acne or rash.  MUSCULOSKELETAL: No muscle or joint pain.  NEUROLOGIC: No tingling or weakness.  PSYCHIATRIC: See history of present illness for details.   PHYSICAL EXAMINATION: VITAL SIGNS:  Blood pressure 132/80, pulse 110, respirations 20, temperature 98.4.  GENERAL: This is a slightly obese young male in no acute distress. The rest of the physical examination is deferred to his primary attending.   LABORATORY DATA: Chemistries are within normal limits except for blood glucose of 234. Blood alcohol level is 0. LFTs within normal limits except for AST of 131 and ALT of 355. Lithium level is 0.61. Urine tox screen is positive for benzodiazepines and tricyclic antidepressants. CBC within normal limits except for white blood count of 13.7. Urinalysis is not suggestive of urinary tract infection. Serum acetaminophen less than 2. Serum salicylates 3.4.   MENTAL STATUS EXAMINATION: The patient is alert and oriented to person, place, time and situation. He is pleasant, polite and cooperative. He is well groomed. He wears hospital scrubs. He maintains good eye contact. His speech is soft, deliberate and slow. His mood is fine  with flat affect. Thought process is logical with its own logic. Thought content: He is paranoid and delusional. He endorses auditory hallucinations. His cognition is grossly intact. Registration, recall, short and long-term memory are all intact. He is a fair historian. He is of average intelligence and fund of knowledge. His insight and judgment are fair.   DIAGNOSES: AXIS I:  Schizoaffective disorder, bipolar type.  AXIS II:  Personality disorder, not otherwise specified.  AXIS III:  Obesity,  AXIS IV:  Mental illness.  AXIS V:  Global assessment of functioning 55.   PLAN:  1.  The patient does not meet criteria for involuntary  inpatient psychiatric commitment. Please discharge as appropriate.  2.  He is to continue all medications as prescribed in the community by Dr. Janeece Riggers.  3.  We will increase the dose of Tranxene from 7.5 at bedtime to 7.5 twice daily. Prescription was given.  4.  He will follow up with Dr. Janeece Riggers.  5.  His group home will pick him up.     ____________________________ Braulio Conte B. Jennet Maduro, MD jbp:dmm D: 09/16/2013 17:37:02 ET T: 09/16/2013 19:24:47 ET JOB#: [id number]  cc: Berdina Cheever B. Jennet Maduro, MD, <Dictator> Shari Prows MD ELECTRONICALLY SIGNED 09/26/2013 7:25

## 2014-08-24 LAB — HEPATIC FUNCTION PANEL A (ARMC)
Albumin: 4.4 g/dL
Alkaline Phosphatase: 56 U/L
Bilirubin,Total: 0.7 mg/dL
SGOT(AST): 77 U/L — ABNORMAL HIGH
SGPT (ALT): 165 U/L — ABNORMAL HIGH
Total Protein: 7.4 g/dL

## 2014-08-24 LAB — CBC WITH DIFFERENTIAL/PLATELET
BASOS PCT: 0.7 %
Basophil #: 0.1 10*3/uL (ref 0.0–0.1)
Eosinophil #: 0.6 10*3/uL (ref 0.0–0.7)
Eosinophil %: 4.2 %
HCT: 44.7 % (ref 40.0–52.0)
HGB: 14.5 g/dL (ref 13.0–18.0)
LYMPHS PCT: 19 %
Lymphocyte #: 2.8 10*3/uL (ref 1.0–3.6)
MCH: 30.2 pg (ref 26.0–34.0)
MCHC: 32.5 g/dL (ref 32.0–36.0)
MCV: 93 fL (ref 80–100)
MONO ABS: 1 x10 3/mm (ref 0.2–1.0)
Monocyte %: 6.9 %
NEUTROS ABS: 10.1 10*3/uL — AB (ref 1.4–6.5)
Neutrophil %: 69.2 %
Platelet: 264 10*3/uL (ref 150–440)
RBC: 4.82 10*6/uL (ref 4.40–5.90)
RDW: 14.5 % (ref 11.5–14.5)
WBC: 14.6 10*3/uL — AB (ref 3.8–10.6)

## 2014-08-24 NOTE — Discharge Summary (Signed)
PATIENT NAME:  [name], [name] MR#:  [id number] DATE OF BIRTH:  May 28, 1980  DATE OF ADMISSION:  06/09/2011 DATE OF DISCHARGE:  06/14/2011  HOSPITAL COURSE: See dictated history and physical for details of admission. This is a 34 year old man who was brought to the hospital because of concerns about bizarre statements and behaviors and the possibility of dangerousness at home. The patient has a history of schizophrenia. Parents reported that he had had multiple odd behaviors over the last several weeks. The event that brought him specifically to the hospital was that he had claimed that he thought that the next-door neighbor was shooting at him, an event which evidently did not occur. The patient seems to have had some hallucinations and paranoia around this. He had made a comment at one point about how he was going to get a gun, although it was later confirmed that he had no access to guns and had not actually done anything aggressive or hostile. In the hospital the patient did not display any aggression or frankly bizarre behavior. He does admit to some disorganized and scattered thinking. His insight is only partial. He did show an understanding that he needed to stay on medication. Overall he seemed to have some ongoing symptoms of schizophrenia but also to have some control into the medication that he was taking. Some medication adjustments were done. He was not continued on stimulants which he had been taking outside the hospital. There was no evidence of any diagnosis that would clearly indicate stimulants and there seemed to be a possibility they made him more agitated. His Abilify dose was adjusted to 15 mg a day from his previous 10 mg a day. He tolerated this well. The patient was treated with diazepam 5 mg 3 to 4 times a day as he had been taking outside the hospital. Seroquel was used just 100 mg at night. We spoke with the family who had ongoing concerns. They had been educated about the nature of  schizophrenia but seem to still have possibly overly optimistic expectations for his recovery. The patient again was not dangerous or aggressive or threatening any time while he was in the hospital. He had outpatient follow-up arranged with Dr. Elesa MassedWard. The patient was discharged home on current medications to be followed up in the community.   DISCHARGE MEDICATIONS:  1. Aripiprazole 15 mg p.Ewing. daily.  2. Quetiapine 50 mg q.6 hours p.r.n. agitation and 100 mg at bedtime.  3. Diazepam 5 mg 3 to 4 a day. 4. Cogentin 1 mg twice a day.   LABORATORY, DIAGNOSTIC, AND RADIOLOGICAL DATA: The urinalysis was unremarkable. Drug screen was positive for cannabis and for benzodiazepines. The patient was counseled about the need to stay off of cannabis with his disease. CBC showed an elevated white count at 15. No other symptoms to go with it. Alcohol level undetectable. Chemistries unremarkable.   MENTAL STATUS EXAM AT DISCHARGE: Casually dressed, reasonably well groomed young man who looks his stated age. Good eye contact. Normal psychomotor activity. Speech normal in rate and tone. A little bit blunted. Affect a little bit constricted but not bizarrely so. Did not appear to be depressed or hostile. Mood stated as fine. Thoughts were a little bit slowed but not grossly bizarre or disorganized. Able to hold a reasonable conversation and show pretty good insight. Denied suicidal or homicidal ideation. Denied acute hallucinations or paranoia.   DISPOSITION: Discharge back to his family's home with follow-up with Dr. Elesa MassedWard.   DIAGNOSES PRINCIPLE AND  PRIMARY:  AXIS I: Schizophrenia, paranoid type.   SECONDARY DIAGNOSES:  AXIS I: Marijuana abuse.   AXIS II: No diagnosis.   AXIS III: No diagnosis.   AXIS IV: Moderate. Chronic ongoing stress from illness and conflict with family.   AXIS V: Functioning at time of discharge 55.   ____________________________ Audery Amel, MD jtc:drc D: 06/27/2011 13:00:28  ET T: 06/27/2011 13:28:59 ET JOB#: [id number]  cc: Audery Amel, MD, <Dictator> Audery Amel MD ELECTRONICALLY SIGNED 06/27/2011 13:52
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
## 2014-08-24 NOTE — Discharge Summary (Signed)
PATIENT NAME:  [name], [name] MR#:  [id number] DATE OF BIRTH:  05-Jul-1980  DATE OF ADMISSION:  07/12/2011 DATE OF DISCHARGE:  07/15/2011  HISTORY OF PRESENT ILLNESS: [name] was admitted to the Inpatient Behavioral Health Unit on 07/12/2011 with delusions that there were transmitters from the basement sending beams through his brain. He was also believing that his food was poisoned. He had been running around his house with a hammer trying to kill the auditory hallucinations. His father had to petition for his commitment.   ANCILLARY CLINICAL DATA: None.   HOSPITAL COURSE: [name] was admitted to the Inpatient Behavioral Health Unit and underwent milieu and group psychotherapy. He quickly re-stabilized after being given a regular regimen of his previously effective psychiatric medications along with milieu and group psychotherapy. He was continued on his Abilify 10 mg daily, benztropine 1 mg b.i.d. He was also receiving Seroquel 100 mg at bedtime. As an outpatient, he had been receiving Valium 5 mg q.i.d. to help with akathisia and avoid dry mouth with Cogentin. He was switched to Klonopin 1 mg b.i.d. which was more effective and resulted in less sedation. His hallucinations decreased him to their baseline level-soft in volume and easily ignored by the patient.   CONDITION ON DISCHARGE: By March 15th, [name] has normal interests and constructive future goals. He has been talking with his father on the phone with constructive conversations. He is not having any adverse medication effects. His interests are normal. Appetite is normal. Sleep is normal.   MENTAL STATUS EXAM UPON DISCHARGE: [name] is alert. His eye contact is good. Concentration is normal. He is oriented to all spheres. Memory is intact to immediate, recent, and remote. Fund of knowledge, intelligence, and use of language are normal. Speech involves normal rate and prosody without dysarthria. Thought process is logical,  coherent, and goal directed. No looseness of associations. Thought content: No thoughts of harming himself, no thoughts of harming others. No delusions. The hallucinations are very faint and intermittent. He can ignore them easily. There are no command destructive hallucinations. Insight is intact. Judgment is intact. Mood is within normal limits. Affect is slightly blunted.    DISCHARGE DIAGNOSES:  AXIS I:  1. Schizophrenia, paranoid type, chronic with acute exacerbation, now clinically stable.  2. Anxiety disorder, not otherwise specified, stable.   AXIS II: [name] has been tried on single antipsychotic therapy multiple times in the past. He has not responded adequately. One monotherapy trial was with Risperdal. He also was tried on Abilify by itself. There have been other antipsychotics as well.   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   AXIS IV: Primary support group.   AXIS V: 55.   [name] is not at risk to harm himself or others. He agrees to call Emergency Services immediately for any thoughts of harming himself, thoughts of harming others, or distress.   DIET: Regular.   ACTIVITY: Routine.   DISCHARGE MEDICATIONS:  1. Seroquel 100 mg at bedtime.  2. Abilify 15 mg at bedtime. 3. Cogentin 1 mg b.i.d.  4. Klonopin 1 mg t.i.d.   FOLLOW UP:  1. Follow up with the Donnie CoffinElla McRae at [facility] on March 19th at 11:00 a.m.  2. Follow up with Dr. Elesa MassedWard with [facility] on 08/02/2011.  ____________________________ Adelene AmasJames S. Pattricia Weiher, MD jsw:cbb D: 07/24/2011 23:27:33 ET T: 07/25/2011 10:22:35 ET JOB#: [id number]  cc: Adelene AmasJames S. Ione Sandusky, MD, <Dictator> Lester CarolinaJAMES S Cedar Roseman MD ELECTRONICALLY SIGNED 07/25/2011 19:58

## 2014-08-24 NOTE — H&P (Signed)
PATIENT NAME:  [name], [name] MR#:  [id number] DATE OF BIRTH:  [birth year]  DATE OF ADMISSION:  07/12/2011  CHIEF COMPLAINT AND IDENTIFYING DATA: [name] is a 34 year old male presenting to the Emergency Department of [facility] with severe psychosis.   He has been having delusions that there are transmitters from the basement sending beams through his brain. He also believes that his food is being poisoned. He has been running around his house with a hammer trying to kill the auditory hallucinations. His father had to contact emergency services.   [name] is oriented to all spheres. He does not have any thought disorganization or clouding of consciousness. There does not appear to be any precipitating stress. According to the history he has been taking his maintenance psychotropic medications. The duration of the above symptoms has been a progressive pattern of approximately three days prior to presentation. Please see the past medical history for his current medication regimen.  PAST MEDICAL HISTORY  HOME MEDICATIONS:  1. Seroquel 100 mg daily.  2. Valium 5 mg q.i.d.  3. Cogentin 1 mg b.i.d. 4. Seroquel 50 mg every six hours p.r.n.  5. Abilify 15 mg daily.  ALLERGIES: No known drug allergies.   MEDICAL PROBLEMS: 1. Asthma.  2. History of right patella tendon repair.   PSYCHIATRIC HISTORY: [name] does have a history of several psychotic exacerbations. There is report of an attention deficit hyperactivity disorder with treatment history including Focalin and Concerta. He carries a diagnosis of schizophrenia, paranoid type.   FAMILY PSYCHIATRIC HISTORY: None known.   SOCIAL HISTORY: Please see the above. [name] is unemployed. He is disabled. He did finish high school and was able to only achieve a small amount of college. He denies alcohol. He does occasionally smoke marijuana.   LABORATORY, DIAGNOSTIC, AND RADIOLOGICAL DATA: Urinalysis  unremarkable. SGPT normal. TSH normal. SGOT normal. BUN 21, creatinine 0.9, WBC was elevated at 14.9, hemoglobin and platelet count normal. EKG normal with QTc of 428 ms. Aspirin negative. Ethanol negative. Tylenol negative. Urine drug screen unremarkable except for benzodiazepine positive.   REVIEW OF SYSTEMS: Constitutional, HEENT, mouth, neurologic, psychiatric, cardiovascular, respiratory, gastrointestinal, genitourinary, skin, musculoskeletal, hematologic, lymphatic, endocrine, metabolic all unremarkable.   PHYSICAL EXAMINATION:  VITAL SIGNS: Temperature 98.4, pulse 93, respiratory rate 20, blood pressure 126/87.   GENERAL APPEARANCE: [name] is a young male laying in a supine position on his hospital bed with no abnormal involuntary movements. He has no cachexia. His muscle tone is normal. His grooming is normal. Hygiene is normal.   [name] is alert. His eye contact is good. He is oriented to all spheres. His memory is intact to immediate, recent, and remote. His concentration is mildly decreased. Abstraction is intact. Speech involves normal rate and prosody without dysarthria. Thought process is coherent. Thought content: Please see the history of present illness. [name] has no thoughts of harming himself or others. However, his insight and judgment are poor. His affect is anxious. Mood is anxious.   ASSESSMENT:  AXIS I:  1. Schizophrenia, paranoid type, chronic with acute exacerbation.  2. Anxiety disorder, not otherwise specified.   AXIS II: Deferred.   AXIS III: History of right patellar tendon repair. [name] states that he has a chronic pain condition due to this and he receives opioid pain medication as an outpatient. By the history it sounds like it is Percocet.   AXIS IV: Primary support group.   AXIS V: 30.  [name] current psychotic state impairs his judgment and makes him at risk for lethal self-neglect.  His condition has calmed to the point  that he is cooperative and redirectable.   PLAN:  1. Supportive milieu and group psychotherapy.  2. Will continue a steady regimen of antipsychotic therapy, Abilify 10 mg daily starting out along with Seroquel 100 mg at bedtime to start out.  We will also use 25 mg of Seroquel q.i.d. p.r.n. agitation. The conservative approach of restarting his antipsychotics is utilized given that the duration that he might have been off of them is unknown and restarting them at a higher dose could risk acute dystonia.  3. Would continue clonazepam as opposed to Valium for anti-acute anxiety at clonazepam 1 mg b.i.d.  We will have Ativan 2 mg q. 6 h. available for anti-acute agitation. We will continue Cogentin 1 mg b.i.d. for anti-EPS.  ____________________________ Adelene AmasJames S. Jaylena Holloway, MD jsw:bjt D: 07/13/2011 23:28:25 ET T: 07/14/2011 08:15:19 ET JOB#: [id number]  cc: Adelene AmasJames S. Kenichi Cassada, MD, <Dictator> Lester CarolinaJAMES S Roy Wisdom MD ELECTRONICALLY SIGNED 07/14/2011 12:23
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
## 2014-08-26 LAB — WBC: WBC: 17.7 10*3/uL — AB (ref 3.8–10.6)

## 2014-08-26 LAB — DIFFERENTIAL
BASOS PCT: 0.9 %
Basophil #: 0.2 10*3/uL — ABNORMAL HIGH (ref 0.0–0.1)
EOS ABS: 0.6 10*3/uL (ref 0.0–0.7)
Eosinophil %: 3.5 %
Lymphocyte #: 2.7 10*3/uL (ref 1.0–3.6)
Lymphocyte %: 15 %
MONO ABS: 1.5 x10 3/mm — AB (ref 0.2–1.0)
Monocyte %: 8.4 %
Neutrophil #: 12.8 10*3/uL — ABNORMAL HIGH (ref 1.4–6.5)
Neutrophil %: 72.2 %

## 2014-08-26 LAB — LITHIUM LEVEL: Lithium: 0.59 mmol/L — ABNORMAL LOW (ref 0.60–1.20)

## 2014-08-30 MED ORDER — ALUM & MAG HYDROXIDE-SIMETH 200-200-20 MG/5ML PO SUSP
30.0000 mL | ORAL | Status: DC | PRN
  Administered 2014-08-31 – 2014-09-01 (×2): 30 mL via ORAL
  Filled 2014-08-30 (×5): qty 30

## 2014-08-30 MED ORDER — METOPROLOL SUCCINATE ER 25 MG PO TB24
25.0000 mg | ORAL_TABLET | Freq: Every day | ORAL | Status: DC
  Administered 2014-08-31 – 2014-09-04 (×5): 25 mg via ORAL
  Filled 2014-08-30 (×6): qty 1

## 2014-08-30 MED ORDER — DOCUSATE SODIUM 100 MG PO CAPS
200.0000 mg | ORAL_CAPSULE | Freq: Two times a day (BID) | ORAL | Status: DC
  Administered 2014-08-31 – 2014-09-08 (×17): 200 mg via ORAL
  Filled 2014-08-30 (×18): qty 2

## 2014-08-30 MED ORDER — CLOZAPINE 25 MG PO TABS
350.0000 mg | ORAL_TABLET | Freq: Every day | ORAL | Status: DC
  Administered 2014-08-31 – 2014-09-07 (×8): 350 mg via ORAL
  Filled 2014-08-30 (×8): qty 2

## 2014-08-30 MED ORDER — LITHIUM CARBONATE ER 300 MG PO TBCR
300.0000 mg | EXTENDED_RELEASE_TABLET | Freq: Every day | ORAL | Status: DC
  Administered 2014-08-31 – 2014-09-08 (×9): 300 mg via ORAL
  Filled 2014-08-30 (×9): qty 1

## 2014-08-30 MED ORDER — CLOZAPINE 25 MG PO TABS
50.0000 mg | ORAL_TABLET | Freq: Every day | ORAL | Status: DC
  Administered 2014-08-31: 50 mg via ORAL
  Filled 2014-08-30: qty 2

## 2014-08-30 MED ORDER — FLUTICASONE PROPIONATE HFA 44 MCG/ACT IN AERO
2.0000 | INHALATION_SPRAY | Freq: Two times a day (BID) | RESPIRATORY_TRACT | Status: DC
  Administered 2014-08-31 – 2014-09-08 (×17): 2 via RESPIRATORY_TRACT

## 2014-08-30 MED ORDER — ACETAMINOPHEN 325 MG PO TABS: 650.0000 mg | ORAL_TABLET | ORAL | Status: DC | PRN

## 2014-08-30 MED ORDER — METHYLPHENIDATE HCL 5 MG PO TABS
5.0000 mg | ORAL_TABLET | ORAL | Status: DC
  Administered 2014-08-31 – 2014-09-05 (×12): 5 mg via ORAL
  Filled 2014-08-30 (×12): qty 1

## 2014-08-30 MED ORDER — SENNA 8.6 MG PO TABS
2.0000 | ORAL_TABLET | Freq: Every day | ORAL | Status: DC
  Administered 2014-08-31 – 2014-09-07 (×8): 17.2 mg via ORAL
  Filled 2014-08-30 (×9): qty 2

## 2014-08-30 MED ORDER — LITHIUM CARBONATE ER 300 MG PO TBCR
600.0000 mg | EXTENDED_RELEASE_TABLET | Freq: Every day | ORAL | Status: DC
  Administered 2014-08-31 – 2014-09-07 (×8): 600 mg via ORAL
  Filled 2014-08-30 (×10): qty 2

## 2014-08-30 MED ORDER — NICOTINE 14 MG/24HR TD PT24
14.0000 mg | MEDICATED_PATCH | Freq: Every day | TRANSDERMAL | Status: DC
  Administered 2014-08-31 – 2014-09-08 (×8): 14 mg via TRANSDERMAL
  Filled 2014-08-30 (×9): qty 1

## 2014-08-30 MED ORDER — NICOTINE 10 MG IN INHA
1.0000 | RESPIRATORY_TRACT | Status: DC | PRN
  Administered 2014-09-02: 1 via RESPIRATORY_TRACT

## 2014-08-31 DIAGNOSIS — B1920 Unspecified viral hepatitis C without hepatic coma: Secondary | ICD-10-CM | POA: Diagnosis present

## 2014-08-31 DIAGNOSIS — F250 Schizoaffective disorder, bipolar type: Secondary | ICD-10-CM | POA: Diagnosis present

## 2014-08-31 DIAGNOSIS — K5900 Constipation, unspecified: Secondary | ICD-10-CM | POA: Diagnosis present

## 2014-08-31 DIAGNOSIS — E669 Obesity, unspecified: Secondary | ICD-10-CM | POA: Diagnosis present

## 2014-08-31 DIAGNOSIS — R000 Tachycardia, unspecified: Secondary | ICD-10-CM | POA: Diagnosis present

## 2014-08-31 DIAGNOSIS — I10 Essential (primary) hypertension: Secondary | ICD-10-CM | POA: Diagnosis present

## 2014-08-31 DIAGNOSIS — J449 Chronic obstructive pulmonary disease, unspecified: Secondary | ICD-10-CM | POA: Diagnosis present

## 2014-08-31 MED ORDER — CLOZAPINE 100 MG PO TABS
100.0000 mg | ORAL_TABLET | Freq: Every day | ORAL | Status: DC
  Administered 2014-09-01: 100 mg via ORAL
  Filled 2014-08-31: qty 1

## 2014-08-31 MED ORDER — CLOZAPINE 25 MG PO TABS
25.0000 mg | ORAL_TABLET | Freq: Once | ORAL | Status: AC
  Administered 2014-08-31: 25 mg via ORAL
  Filled 2014-08-31: qty 1

## 2014-08-31 NOTE — Consult Note (Signed)
PAtient with schizophreina. No new complaints. Continues to show poor self care and responding to internal stimuli. ROS minimizes and denies everything Minimal speach. Disorganized thoughts. Flat affect. Denies SI or HI. Poor insight. now to inpatient ward, I hope. Continue medication. Precautions in place  Electronic Signatures: Shannen Flansburg, Jackquline DenmarkJohn T (MD)  (Signed on 21-Apr-16 21:29)  Authored  Last Updated: 21-Apr-16 21:29 by Audery Amellapacs, Kaitlyn Skowron T (MD)

## 2014-08-31 NOTE — [facility] Group Notes (Signed)
[facility] Group Notes:  (Nursing/MHT/Case Management/Adjunct)  Date:  08/31/2014  Time:  9:44 PM  Type of Therapy:  Group Therapy  Participation Level:  Active  Participation Quality:  Appropriate  Affect:  Appropriate  Cognitive:  Appropriate  Insight:  Appropriate  Engagement in Group:  Engaged  Modes of Intervention:  Education  Summary of Progress/Problems:  Roy MunroeClarence Melvin Zaim Ewing 08/31/2014, 9:44 PM
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
## 2014-08-31 NOTE — Progress Notes (Signed)
Pt. Denies depression, but states he is hearing voices "all the time for years." Denies HV, SI/HI.

## 2014-08-31 NOTE — Plan of Care (Signed)
Problem: Diagnosis: Increased Risk For Suicide Attempt Goal: LTG-Patient Will Show Positive Response to Medication LTG (by discharge) : Patient will show positive response to medication and will participate in the development of the discharge plan.  Outcome: Progressing Taking meds. As scheduled. Denies depression and no suicidal thoughts. Goal: LTG-Patient Will Report Improved Mood and Deny Suicidal LTG (by discharge) Patient will report improved mood and deny suicidal ideation.  Outcome: Progressing Denying suicidal ideation and depression. Goal: LTG-Patient Will Report Improvement in Psychotic Symptoms LTG (by discharge) : Patient will report improvement in psychotic symptoms.  Outcome: Not Progressing Continues to endorse auditory hallucinations. Goal: STG-Patient Will Attend All Groups On The Unit Outcome: Not Progressing Is not attending all groups. Goal: STG-Patient Will Report Suicidal Feelings to Staff Outcome: Progressing Presently denying suicidal feelings. Goal: STG-Patient Will Comply With Medication Regime Outcome: Progressing Takes meds. As ordered. Goal: STG-Patient will be able to identify reason for suicidal STG-Patient will be able to identify reason for suicidal ideation  Outcome: Progressing Verbalizes frustration that he hears voices and meds. Don't help.
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
## 2014-08-31 NOTE — H&P (Signed)
PATIENT NAME:  [name], [name] MR#:  [id number] DATE OF BIRTH:  08/21/1980  DATE OF ADMISSION:  08/22/2014  CHIEF COMPLAINT: "[name] has been 'jumping out of my window.'" The patient  indicates there is a person, [name], who through review of the paperwork was perhaps a former high school classmate. He states that he believes this individual is doing things to him,  such as shooting him up with some type of drug or chemical. He states this happened earlier this week. He states that his group home manager heard him yelling loudly and then eventually told him to get ready to come to the hospital. He states that no medications have worked for him to control these voices or his beliefs. He states the only thing that controls it is when he decides to do it. He states he has been taking his medications as prescribed. He denies being depressed. He denies any suicidal ideation. Denies any homicidal ideation.   He was noted in his group home to be doing punching and kickboxing moves in the hallway. He states that this is the only way to take care of [name].   PAST PSYCHIATRIC HISTORY: The patient states he has had 2-3 hospitalizations. The last one was 2-3 months ago at this facility. He does report prior trials of Zyprexa, Depakote and Risperdal. In regard to substances, he states he used marijuana 10 years ago. He states that he did use alcohol heavily for 1 to 2 years when he was in his early 1220s. He states his use then would be 8-12 beers every few days. He denies any past suicide attempts.   PAST MEDICAL HISTORY: He states he had a right knee patella surgery 4-6 years ago.   FAMILY HISTORY OF MENTAL ILLNESS: He states he does not know of any a diagnosed mental illness in his family.   SOCIAL HISTORY: The patient has been in a group home for the past 2-1/2 years. He is noted to get along reasonably well with others other than during periods of psychosis. He has some contact with his family.    CURRENT MEDICATIONS: Cogentin 0.5 mg twice daily, Tranxene 7.5 mg 3 times a day, Clozapine  250 mg at night, Flexeril 10 mg twice a day, Flonase inhaler 2 puffs q. 12 h with spacer, haloperidol 5 mg twice a day as needed for agitation, lithium carbonate 600 mg at night and 300 milligrams in the morning, propranolol 10 mg twice a day, tramadol 50 mg twice a day, as needed for pain, Abilify Maintena 400 mg.   ALLERGIES: No known drug allergies.   MENTAL STATUS EXAMINATION: Poorly groomed, young male, appearing his stated age of 34. He was alert and oriented x3. His speech was normal rate and volume. He was cooperative with the interview. His thought process was disorganized. His thought content was significant for delusions. There were no auditory or visual hallucinations visible during this interview; however, per history, he does have visual and tactile hallucinations. During the interview, he did touch his arm and say it was shaking and stated that it was [name] making that happened. He denies any suicidal ideation. Denies any homicidal ideation. He denies any paranoia outside of the delusion about [name]. His remote memory is intact as evident by his ability to describe past history and treatment. His immediate memory is intact as evident by his ability to discuss recent events in terms of getting him into the hospital.   PHYSICAL EXAMINATION:  VITAL SIGNS: Temperature of 98.4, pulse of 103, respirations 20, blood pressure 132/93 sitting and 134/85 standing. His gait was grossly within normal limits. Muscle strength was grossly within normal limits.   No other physical exam is indicated given evaluation in the Emergency Room.   LABORATORY RESULTS: His comprehensive metabolic panel is within normal limits except for a mildly elevated BUN at 22 and glucose elevated at 110. He has elevated ALT at 152 and AST at 67. His urine tox screen is positive for benzodiazepines. Lithium level is 0.99.  Ethanol level less than 5. CBC is white count slightly elevated at 15.7. His urinalysis is within normal limits. His acetaminophen and salicylate levels were within normal limits. His clozapine level is 364. His clozapine and norclozapine level, combined, is 450, and his norclozapine level was 86.   DIAGNOSES:  1.  Schizophrenia.  2.  Marijuana abuse, moderate, in remission.  3.  Chronic obstructive pulmonary disease, mild.   ASSESSMENT AND PLAN: The patient's clozapine level appears to be therapeutic. He is also getting Abilify Maintena; however, there is no clarity as to when he last had his injection. Per the patient, he believes he had his injection about 1 to 2 weeks ago. We will at this time try to increase his Haldol to address the delusions and agitation. We will reorder his liver function tests given the elevations noted above. We will also recheck a CBC, secondary to the elevated white count noted.     ____________________________ Loralie Champagne Mayford Knife, MD alw:je D: 08/23/2014 12:28:08 ET T: 08/23/2014 13:22:10 ET JOB#: [id number]  cc: Leory Plowman L. Mayford Knife, MD, <Dictator> Kerin Salen MD ELECTRONICALLY SIGNED 08/27/2014 13:03

## 2014-08-31 NOTE — Progress Notes (Signed)
Pt. Has been calm and cooperative today.  Interacting appropriately with staff and peers.  Medicated once for complaint of indigestion.  Appetite good.  Attending groups.

## 2014-09-01 ENCOUNTER — Encounter: Payer: Self-pay | Admitting: Psychiatry

## 2014-09-01 MED ORDER — CLOZAPINE 25 MG PO TABS
125.0000 mg | ORAL_TABLET | Freq: Every day | ORAL | Status: DC
  Administered 2014-09-02 – 2014-09-03 (×2): 125 mg via ORAL
  Filled 2014-09-01 (×2): qty 1

## 2014-09-01 MED ORDER — LORAZEPAM 2 MG PO TABS
2.0000 mg | ORAL_TABLET | Freq: Once | ORAL | Status: AC
  Administered 2014-09-01: 2 mg via ORAL

## 2014-09-01 MED ORDER — LORAZEPAM 2 MG PO TABS
ORAL_TABLET | ORAL | Status: AC
  Filled 2014-09-01: qty 1

## 2014-09-01 MED ORDER — LORAZEPAM 2 MG PO TABS
2.0000 mg | ORAL_TABLET | Freq: Four times a day (QID) | ORAL | Status: DC | PRN
Start: 2014-09-01 — End: 2014-09-08
  Administered 2014-09-01 – 2014-09-05 (×10): 2 mg via ORAL
  Filled 2014-09-01 (×10): qty 1

## 2014-09-01 MED ORDER — LORAZEPAM 2 MG/ML IJ SOLN: 2.0000 mg | Freq: Once | INTRAMUSCULAR | Status: AC

## 2014-09-01 NOTE — Plan of Care (Signed)
Problem: Consults Goal: Suicide Risk Patient Education (See Patient Education module for education specifics)  Outcome: Progressing Denies suicidal thoughts.

## 2014-09-01 NOTE — Progress Notes (Signed)
Recreation Therapy Notes  INPATIENT RECREATION THERAPY ASSESSMENT  Patient Details Name: [name] MRN: [id number] DOB: 1980/10/29 Today's Date: 09/01/2014   Assessment completed 04.25.16 at 11:42 am.  Patient Stressors:  Patient did not report any stressors.  Coping Skills:    Arguments, Avoidance, Exercise, Art/dance, Music, and Sports  Personal Challenges:  Concentration, Expressing yourself, Relationships, Self-esteem, Time management, and Trusting others  Leisure Interests (2+):   "I don't know. It's too violent right now." TV and music  Awareness of Community Resources:   Yes  Community Resources:   Bowling, [place], and Driving range  Current Use:  No  If no, Barriers?:  "I don't know"  Patient Strengths:   Tries to be indestructible, and handsome   Patient Identified Areas of Improvement:   Improve vertical by 3-4 inches  Current Recreation Participation:   Play basketball, Dance, Go to day program, Watch TV, Listen to music channels  Patient Goal for Hospitalization:   For the voices to stop  [place]idence[place]   [place]  County of Residence:   [place]   Current SI (including self-harm):   No  Current HI:    No  Consent to Intern Participation:  N/A   Jacquelynn Creelizabeth M Aurorah Schlachter, LRT/CTRS 09/01/2014, 5:28 PM

## 2014-09-01 NOTE — Progress Notes (Signed)
[name] alert,pleasant and cooperative.Mood sad,flat and depressed.Minimal interaction with peers.Med compliant.States he always has auditory hallucinations.He has had for 12 years and no meds work on his hallucinations.Will continue to monitor closely.

## 2014-09-01 NOTE — Progress Notes (Signed)
WNL

## 2014-09-01 NOTE — Progress Notes (Signed)
Recreation Therapy Group Note Template    Date: [id number] Time: 3:00 PM Location: Craft Room  Group Topic: Self-Expression  Goal Area(s) Addresses:  Patient will identify one color per emotion listed on wheel. Patient will verbalize benefit of using art as a means of self-expression. Patient will verbalize one emotion experienced during session. Patient will be educated on other forms of self-expression.  Behavioral Response: Attentive, Interactive, Disruptive/Inappropriate  Intervention: Emotion Wheel  Activity: Patients were given a worksheet with 7 different emotions. Patients were instructed to pick a color for each emotion and color the designated section.   Education: LRT educated patients on self-expression and different forms of self-expression.   Education Outcome: Acknowledges education/In group clarification offered  Clinical Observations/Feedback: Patient completed activity by picking colors for each emotion and drawing symbols for each emotion. Patient contributed to group discussion by stating what colors he picked for each emotion and why he picked those colors. Patient would get sidetracked when answering questions. LRT redirected patient and patient complied.       Jacquelynn CreeElizabeth M Greene, LRT/CTRS 09/01/2014 4:17 PM

## 2014-09-01 NOTE — Progress Notes (Signed)
Patient alert and oriented x 4. He continues to report hallucinations and exhibits delusional thinking. Resistive to any reality orientation. He does deny SI. He is minimally involved in milieu and groups. Taking medications as directed with no adverse effects. Patient anxious for discharge. Will continue to monitor mental status and safety, reality orientation as tolerated, support through discharge transition.

## 2014-09-01 NOTE — Plan of Care (Signed)
Problem: Diagnosis: Increased Risk For Suicide Attempt Goal: STG-Patient Will Comply With Medication Regime Outcome: Progressing Compliant with meds

## 2014-09-01 NOTE — Progress Notes (Signed)
Blunted affect

## 2014-09-01 NOTE — [facility] Group Notes (Signed)
Understanding Treatment PsychoEd Group Attended and participated as best he could. Focused on discharge. Had some difficulty with day of the week and time of day orientation.

## 2014-09-01 NOTE — [facility] Group Notes (Signed)
[facility] Group Notes:  (Nursing/MHT/Case Management/Adjunct)  Date:  09/01/2014  Time:  11:56 PM  Type of Therapy:  Group Therapy  Participation Level:  Active  Participation Quality:  Attentive  Affect:  Appropriate  Cognitive:  Appropriate  Insight:  Appropriate  Engagement in Group:  Off Topic  Modes of Intervention:  Education  Summary of Progress/Problems:  Ethel RanaSherrell M Avamae Dehaan 09/01/2014, 11:56 PM

## 2014-09-01 NOTE — Progress Notes (Signed)
[facility] MD Progress Note  09/01/2014 2:49 PM [name]  MRN:  [id number] Subjective:  Patient continues to report having auditory hallucinations of voices telling him he is getting injected with all sorts of medications. He continues to be delusional as he says he has been fighting with people in all his bones in his arms are broken. Patient has been very intrusive today knocking on my office door multiple times requesting help for his fractured arms.  At examination there is no evidence of any kind of trauma.  He has been seen punching the air in kicking the air since Friday.  He perseverates on being discharged back to his group home and also on getting more ritalin as he thinks with ritalin he will be able to go back to work.    He denies suicidality or homicidality he denies any side effects from his medications as far as physical complaints he complains of some sedation denies major problems with sleep appetite or concentration.  Per nursing today patient was agitated and demanding discharge nurse was unable to redirect him. Patient required Ativan 2 mg prn.  Principal Problem: Schizoaffective disorder, bipolar type Diagnosis:   Patient Active Problem List   Diagnosis Date Noted  . Schizoaffective disorder, bipolar type [F25.0] 08/31/2014  . Obesity [E66.9] 08/31/2014  . HTN (hypertension) [I10] 08/31/2014  . COPD (chronic obstructive pulmonary disease) [J44.9] 08/31/2014  . Sinus tachycardia [I47.1] 08/31/2014  . Constipation [K59.00] 08/31/2014  . Hepatitis C [B19.20] 08/31/2014   Total Time spent with patient: 30 minutes   Past Medical History: No past medical history on file.  Past Surgical History  Procedure Laterality Date  . Right knee -patela operation     Family History: No family history on file. Social History:  History  Alcohol Use No     History  Drug Use  . Yes  . Special: Marijuana    Comment: in remission    History   Social History  . Marital Status:  Single    Spouse Name: N/A  . Number of Children: N/A  . Years of Education: N/A   Social History Main Topics  . Smoking status: Current Every Day Smoker  . Smokeless tobacco: Not on file  . Alcohol Use: No  . Drug Use: Yes    Special: Marijuana     Comment: in remission  . Sexual Activity: Not on file   Other Topics Concern  . Not on file   Social History Narrative  . No narrative on file   Additional History:    Sleep: Good  Appetite:  Good   Assessment:   Musculoskeletal: Strength & Muscle Tone: within normal limits Gait & Station: normal Patient leans: N/A   Psychiatric Specialty Exam: Physical Exam   Review of Systems  Respiratory: Negative for cough.   Gastrointestinal: Negative for nausea, vomiting, abdominal pain, diarrhea and constipation.  Neurological: Negative for headaches.  Psychiatric/Behavioral: Positive for hallucinations. Negative for depression and suicidal ideas. The patient does not have insomnia.     Blood pressure 135/86, pulse 100, temperature 98.4 F (36.9 C), temperature source Oral, resp. rate 18, height 5' 7.99" (1.727 m), weight 91.2 kg (201 lb 1 oz).Body mass index is 30.58 kg/(m^2).  General Appearance: Disheveled  Eye Contact::  Good  Speech:  Clear and Coherent  Volume:  Normal  Mood:  Irritable  Affect:  Constricted  Thought Process:  Tangential  Orientation:  Full (Time, Place, and Person)  Thought Content:  Delusions and  Hallucinations: Auditory  Suicidal Thoughts:  No  Homicidal Thoughts:  No  Memory:  Immediate;   Good Recent;   Good Remote;   Good  Judgement:  Poor  Insight:  Lacking  Psychomotor Activity:  Normal  Concentration:  Fair  Recall:  Good  Fund of Knowledge:Good  Language: Good  Akathisia:  No  Handed:  Right  AIMS (if indicated):     Assets:  Financial Resources/Insurance Housing Social Support  ADL's:  Intact  Cognition: WNL  Sleep:  Number of Hours: 7     Current Medications: Current  Facility-Administered Medications  Medication Dose Route Frequency Provider Last Rate Last Dose  . acetaminophen (TYLENOL) tablet 650 mg  650 mg Oral Q4H PRN Doctor Chlconversion, MD      . alum & mag hydroxide-simeth (MAALOX/MYLANTA) 200-200-20 MG/5ML suspension 30 mL  30 mL Oral Q4H PRN Doctor Chlconversion, MD   30 mL at 08/31/14 1417  . [START ON 09/02/2014] cloZAPine (CLOZARIL) tablet 125 mg  125 mg Oral Daily Jimmy Footman, MD      . cloZAPine (CLOZARIL) tablet 350 mg  350 mg Oral QHS Doctor Chlconversion, MD   350 mg at 08/31/14 2150  . docusate sodium (COLACE) capsule 200 mg  200 mg Oral BID Doctor Chlconversion, MD   200 mg at 09/01/14 0905  . fluticasone (FLOVENT HFA) 44 MCG/ACT inhaler 2 puff  2 puff Inhalation BID Doctor Chlconversion, MD   2 puff at 09/01/14 0820  . lithium carbonate (LITHOBID) CR tablet 300 mg  300 mg Oral Daily Doctor Chlconversion, MD   300 mg at 09/01/14 0905  . lithium carbonate (LITHOBID) CR tablet 600 mg  600 mg Oral QHS Doctor Chlconversion, MD   600 mg at 08/31/14 2146  . LORazepam (ATIVAN) 2 MG tablet           . LORazepam (ATIVAN) tablet 2 mg  2 mg Oral Q6H PRN Jimmy Footman, MD      . methylphenidate (RITALIN) tablet 5 mg  5 mg Oral 2 times per day Doctor Chlconversion, MD   5 mg at 09/01/14 1359  . metoprolol succinate (TOPROL-XL) 24 hr tablet 25 mg  25 mg Oral Daily Doctor Chlconversion, MD   25 mg at 09/01/14 0905  . nicotine (NICODERM CQ - dosed in mg/24 hours) patch 14 mg  14 mg Transdermal Daily Doctor Chlconversion, MD   14 mg at 09/01/14 0904  . nicotine (NICOTROL) 10 MG inhaler 1 continuous puffing  1 continuous puffing Inhalation PRN Doctor Chlconversion, MD      . senna (SENOKOT) tablet 17.2 mg  2 tablet Oral QHS Doctor Chlconversion, MD   17.2 mg at 08/31/14 2146    Lab Results: No results found for this or any previous visit (from the past 48 hour(s)).  Physical Findings: AIMS:  , ,  ,  ,    CIWA:    COWS:      Treatment Plan Summary: Daily contact with patient to assess and evaluate symptoms and progress in treatment and Medication management Schizoaffective disorder: -we will continue with Clozaril titration today I will increase the morning Clozaril to 100 mg by mouth every morning the evening Clozaril will be continued at 350 mg -Continue lithium 300 mg by mouth every morning and 600 mg by mouth daily at bedtime for mood stabilization.  We'll check lithium level on Wednesday  Agitation patient will receive Ativan 2 mg every 6 hours when necessary  Sedation and daytime drowsiness: continued ritalin 5 mg  by mouth twice a day  Hypertension and tachycardia continue metoprolol 50 mg by mouth daily as his pulse continued to be around 100  Tobacco use disorder continue Nicotrol inhaler in NicoDerm patch 14 mg daily  Constipation continue Colace 200 mg by mouth twice a day and Senokot 2 tablets at bedtime  COPD continue Flovent 2 pops daily  Hepatitis C at discharge will refer patient to [facility] care at [facility]   Medical Decision Making:  Established Problem, Stable/Improving (1)     Radene Journeyndrea Hernandez 09/01/2014, 2:49 PM

## 2014-09-01 NOTE — [facility] Group Notes (Signed)
[facility] Group Notes:  (Nursing/MHT/Case Management/Adjunct)  Date:  09/01/2014  Time:  12:25 PM  Type of Therapy:  Psychoeducational Skills  Participation Level:  None  Participation Quality:  Inattentive and Resistant  Affect:  Not Congruent  Cognitive:  Disorganized  Insight:  Limited  Engagement in Group:  Distracting and Limited  Modes of Intervention:  Clarification, Discussion and Education  Summary of Progress/Problems: Pt was asked to leave group due to cause disruption and speaking off topic.   Lynelle SmokeCara Travis [facility] 09/01/2014, 12:25 PM

## 2014-09-01 NOTE — [facility] Group Notes (Signed)
[facility] Group Notes:  (Nursing/MHT/Case Management/Adjunct)  Date:  09/01/2014  Time:  6:05 PM  Type of Therapy:  recreation  Participation Level:  Active  Participation Quality:  Appropriate  Affect:  Appropriate  Cognitive:  Appropriate  Insight:  Appropriate  Engagement in Group:  Engaged  Modes of Intervention:  Socialization  Summary of Progress/Problems:  [name] 09/01/2014, 6:05 PM

## 2014-09-02 LAB — CBC WITH DIFFERENTIAL/PLATELET
BASOS PCT: 1 %
Basophils Absolute: 0.1 10*3/uL (ref 0–0.1)
EOS PCT: 3 %
Eosinophils Absolute: 0.4 10*3/uL (ref 0–0.7)
HCT: 43 % (ref 40.0–52.0)
Hemoglobin: 14.3 g/dL (ref 13.0–18.0)
LYMPHS PCT: 20 %
Lymphs Abs: 2.9 10*3/uL (ref 1.0–3.6)
MCH: 30.7 pg (ref 26.0–34.0)
MCHC: 33.2 g/dL (ref 32.0–36.0)
MCV: 92.3 fL (ref 80.0–100.0)
Monocytes Absolute: 1.2 10*3/uL — ABNORMAL HIGH (ref 0.2–1.0)
Monocytes Relative: 8 %
NEUTROS PCT: 68 %
Neutro Abs: 10 10*3/uL — ABNORMAL HIGH (ref 1.4–6.5)
PLATELETS: 264 10*3/uL (ref 150–440)
RBC: 4.66 MIL/uL (ref 4.40–5.90)
RDW: 14.2 % (ref 11.5–14.5)
WBC: 14.6 10*3/uL — ABNORMAL HIGH (ref 3.8–10.6)

## 2014-09-02 NOTE — Plan of Care (Signed)
Problem: [facility] Participation in Recreation Therapeutic Interventions Goal: STG-Patient will demonstrate improved self esteem by identif STG: Self-Esteem - Within 8 treatment sessions, patient will verbalize at least 5 positive affirmation statements in each of 5 treatment sessions to increase self-esteem post d/c.  Outcome: Progressing Treatment session 1: At approximately 9:50 am, LRT met with patient in patient room. Patient verbalized 5 positive affirmation statements. Patient reported it felt "good". Patient reported he did not feel capable in the hospital because he had broken bones while he was here. LRT redirected patient and patient complied.  Leonette Monarch, LRT/CTRS 05.03.16 12:07 pm
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
## 2014-09-02 NOTE — [facility] Group Notes (Signed)
Adult Psychoeducational Group Note  Date:  09/02/2014 Time:  9:30 AM  Group Topic/Focus:  Overcoming Stress:   The focus of this group is to define stress and help patients assess their triggers.  Participation Level:  Active  Participation Quality:  Redirectable  Affect:  Labile  Cognitive:  Delusional  Insight: None  Engagement in Group:  Distracting  Modes of Intervention:  Confrontation  Additional Comments:  CSW introduced self and read group rules. CSW asked group members to introduce themselves and share their "Dream Vacation" destination. CSW introduced group topic of "Overcoming Obstacles" and facilitated a group discussion.  Pt introduced himself and shared that his vacation spot [place] be "[place] an innertube and go tubing down the river". Pt participated in group but was distracting with delusions that there were invisible people he is having to fight daily. Pt was redirectable but not able to participate in group discussion effectively.   [name], [name] T 09/02/2014, 9:30 AM

## 2014-09-02 NOTE — [facility] Group Notes (Signed)
[facility] Group Notes:  (Nursing/MHT/Case Management/Adjunct)  Date:  09/02/2014  Time:  9:25 PM  Type of Therapy:  Group Therapy  Participation Level:  Active  Participation Quality:  Monopolizing  Affect:  Irritable  Cognitive:  Alert  Insight:  Lacking  Engagement in Group:  talked about (putting people's head into a wall). Off topic.  Modes of Intervention:  Education  Summary of Progress/Problems:  Roy MunroeClarence Melvin [name] 09/02/2014, 9:25 PM

## 2014-09-02 NOTE — [facility] Group Notes (Signed)
[facility] Note  09/02/2014 8:52 AM  Participation Quality:  Redirectable  Affect:  Labile  Cognitive:  Delusional  Insight:  Limited  Engagement in Group:  Distracting  Modes of Intervention:  Reality Testing  Summary of Progress/Problems: CSW introduced self, read group rules, and asked group members to introduce themselves and share their SMART goals. CSW distributed daily workbook for Monday on topic of "Wellness" and facilitated a group discussion.   Pt introduced self and shared that his SMART goal is to "have them quit using me like a toy". Pt shared delusional thoughts of fighting and kicking imaginary people while inpatient. Pt states he sees "an ora and they move fast and he was sitting on me". Pt remains delusional but redirectable and allows other group members to share when redirected.   [name], [name] 09/02/2014, 8:52 AM

## 2014-09-02 NOTE — Progress Notes (Signed)
Recreation Therapy Notes  Date: 05.03.16 Time: 3:00 pm Location: Craft Room  Group Topic: Self-esteem  Goal Area(s) Addresses:  Patient will identify positive attributes about self. Patient will identify at least one coping skill.  Behavioral Response: Did not attend  Intervention: All About Me  Activity: Patients were instructed to make an All About Me pamphlet including positive traits about themselves, healthy coping skills, and their healthy support system.  Education: LRT educated patient on self-esteem, coping skills, and healthy support systems  Education Outcome: Patient did not attend group.  Clinical Observations/Feedback: Patient did not attend group.         Jacquelynn CreeGreene,Eliseo Withers M 09/02/2014 4:20 PM
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
## 2014-09-02 NOTE — Progress Notes (Signed)
Patient is alert and oriented to person place and situation.  He has a blunted affect with good eye contact. Patient did have one outburst earlier this am.  Given PRN medication for agitation. Patient denies suicidal thoughts when asked.  He also denies auditory and visual hallucinations although he complained of " a broken bone in my arm".  Appears to have some delusions but is redirectable. Medication and group compliant. Will cont to monitor for safety.

## 2014-09-02 NOTE — [facility] Group Notes (Signed)
[facility] Group Notes:  (Nursing/MHT/Case Management/Adjunct)  Date:  09/02/2014  Time:  12:05 PM  Type of Therapy:  Group Therapy  Participation Level:  Active  Participation Quality:  Attentive  Affect:  Anxious  Cognitive:  Oriented  Insight:  Good  Engagement in Group:  Engaged  Modes of Intervention:  Discussion and Education  Summary of Progress/Problems:  [name] 09/02/2014, 12:05 PM

## 2014-09-03 LAB — LITHIUM LEVEL: LITHIUM LVL: 0.58 mmol/L — AB (ref 0.60–1.20)

## 2014-09-03 MED ORDER — CLOZAPINE 25 MG PO TABS
175.0000 mg | ORAL_TABLET | Freq: Every day | ORAL | Status: DC
  Administered 2014-09-04: 175 mg via ORAL
  Filled 2014-09-03: qty 6
  Filled 2014-09-03: qty 1

## 2014-09-03 MED ORDER — CLOZAPINE 25 MG PO TABS
25.0000 mg | ORAL_TABLET | Freq: Once | ORAL | Status: AC
  Administered 2014-09-03: 25 mg via ORAL
  Filled 2014-09-03: qty 1

## 2014-09-03 NOTE — Plan of Care (Signed)
Problem: Ineffective individual coping Goal: STG:Pt. will utilize relaxation techniques to reduce stress STG: Patient will utilize relaxation techniques to reduce stress levels  Outcome: Progressing Patient is working on resolving stress issues.

## 2014-09-03 NOTE — Progress Notes (Signed)
Patient ID: Roy NeerJoel O Galant, male   DOB: Mar 24, 1981, 34 y.o.   MRN: [id number] Patient anxious and irritable this morning. He is complaining that he has been here too long and is ready to be discharged. States he still is having AH but they are improved. He denies SI. Will continue to monitor.

## 2014-09-03 NOTE — [facility] Group Notes (Signed)
[facility] Group Notes:  (Nursing/MHT/Case Management/Adjunct)  Date:  09/03/2014  Time:  12:55 PM  Type of Therapy:  Group Therapy  Participation Level:  Did Not Attend    [name] Lea Campbell 09/03/2014, 12:55 PM 

## 2014-09-03 NOTE — Progress Notes (Signed)
Clozaril Monitoring: Pharmacy consulted to monitor clozaril in this 34 year old male continuing on outpatient regimen.  Current orders for clozapine 350mg  PO QHS  And 175mg  qam   4/20 ANC 11.4 4/26 WBC 17.7  ANC 12.8  5/3  WBC 14.6  ANc 10.0   Labs reported to registry, OK to dispense. Next ANC scheduled for 5/10 Pharmacy to follow per consult

## 2014-09-03 NOTE — [facility] Group Notes (Signed)
Adult Psychoeducational Group Note  Date:  09/03/2014 Time:  8:51 AM  Group Topic/Focus:  Diagnosis Education:   The focus of this group is to discuss the major disorders that patients maybe diagnosed with.  Group discusses the importance of knowing what one's diagnosis is so that one can understand treatment and better advocate for oneself.  Participation Level:  Active  Participation Quality:  Attentive, Monopolizing and Redirectable  Affect:  Lethargic and Not Congruent  Cognitive:  Disorganized and Delusional  Insight: Lacking  Engagement in Group:  Financial planner, Monopolizing and Off Topic  Modes of Intervention:  Confrontation, Discussion, Reality Testing and Socialization  Additional Comments:  Pt participated in group but was off topic and disorganized. Pt continues to report delusions of people that only he sees that fight with him and calls one of them "Michealangelo" which pt reports he met when watching "[facility]  Carmell Austria T 09/03/2014, 8:51 AM

## 2014-09-03 NOTE — Progress Notes (Addendum)
[facility] MD Progress Note  09/03/2014 1:46 PM [name]  MRN:  [id number] Subjective:  Today patient denies having major problems or concerns. Today he denies fighting with anybody (as he was seen fighting in the air) or hearing voices.  Denies problems with his mood appetite energy or concentration. Denies having suicidal thoughts or homicidal thoughts.  As far as side effects he does complain of some sedation this morning.  Denies any other side effects.  Denies having any physical complaints, other than the pain in his joints (constantly complaining of having broken bones secondary to "fighting 7000 people).  When told he was not going to be discharged today patient again became agitated and started cursing at these writer.  Patient continue to follow this writer on the hallways and a started knocking loudly all my office door calling me derogatory names.    Per nursing today patient was agitated and psychotic.  Hard to rediretc.  Principal Problem: Schizoaffective disorder, bipolar type Diagnosis:   Patient Active Problem List   Diagnosis Date Noted  . Schizoaffective disorder, bipolar type [F25.0] 08/31/2014  . Obesity [E66.9] 08/31/2014  . HTN (hypertension) [I10] 08/31/2014  . COPD (chronic obstructive pulmonary disease) [J44.9] 08/31/2014  . Sinus tachycardia [I47.1] 08/31/2014  . Constipation [K59.00] 08/31/2014  . Hepatitis C [B19.20] 08/31/2014   Total Time spent with patient: 30 minutes   Past Medical History: No past medical history on file.  Past Surgical History  Procedure Laterality Date  . Right knee -patela operation     Family History: No family history on file. Social History:  History  Alcohol Use No     History  Drug Use  . Yes  . Special: Marijuana    Comment: in remission    History   Social History  . Marital Status: Single    Spouse Name: N/A  . Number of Children: N/A  . Years of Education: N/A   Social History Main Topics  . Smoking status:  Current Every Day Smoker  . Smokeless tobacco: Not on file  . Alcohol Use: No  . Drug Use: Yes    Special: Marijuana     Comment: in remission  . Sexual Activity: Not on file   Other Topics Concern  . Not on file   Social History Narrative  . No narrative on file   Additional History:    Sleep: Good  Appetite:  Good   Assessment:   Musculoskeletal: Strength & Muscle Tone: within normal limits Gait & Station: normal Patient leans: N/A   Psychiatric Specialty Exam: Physical Exam   Review of Systems  HENT: Negative.   Respiratory: Negative.   Gastrointestinal: Negative.   Musculoskeletal: Positive for joint pain.  Psychiatric/Behavioral: Negative.     Blood pressure 131/83, pulse 101, temperature 98.4 F (36.9 C), temperature source Oral, resp. rate 20, height 5' 7.99" (1.727 m), weight 91.2 kg (201 lb 1 oz).Body mass index is 30.58 kg/(m^2).  General Appearance: Disheveled  Eye Contact::  Good  Speech:  Clear and Coherent  Volume:  Normal  Mood:  Irritable  Affect:  Constricted  Thought Process:  Tangential  Orientation:  Full (Time, Place, and Person)  Thought Content:  Delusions and Hallucinations: Auditory  Suicidal Thoughts:  No  Homicidal Thoughts:  No  Memory:  Immediate;   Good Recent;   Good Remote;   Good  Judgement:  Poor  Insight:  Lacking  Psychomotor Activity:  Normal  Concentration:  Fair  Recall:  Good  Fund of Knowledge:Good  Language: Good  Akathisia:  No  Handed:  Right  AIMS (if indicated):     Assets:  Financial Resources/Insurance Housing Social Support  ADL's:  Intact  Cognition: WNL  Sleep:  Number of Hours: 6.25     Current Medications: Current Facility-Administered Medications  Medication Dose Route Frequency Provider Last Rate Last Dose  . acetaminophen (TYLENOL) tablet 650 mg  650 mg Oral Q4H PRN Doctor Chlconversion, MD      . alum & mag hydroxide-simeth (MAALOX/MYLANTA) 200-200-20 MG/5ML suspension 30 mL  30 mL  Oral Q4H PRN Doctor Chlconversion, MD   30 mL at 09/01/14 1631  . [START ON 09/04/2014] cloZAPine (CLOZARIL) tablet 175 mg  175 mg Oral Daily Jimmy FootmanAndrea Hernandez-Gonzalez, MD      . cloZAPine (CLOZARIL) tablet 25 mg  25 mg Oral Once Jimmy FootmanAndrea Hernandez-Gonzalez, MD      . cloZAPine (CLOZARIL) tablet 350 mg  350 mg Oral QHS Doctor Chlconversion, MD   350 mg at 09/02/14 2148  . docusate sodium (COLACE) capsule 200 mg  200 mg Oral BID Doctor Chlconversion, MD   200 mg at 09/03/14 0917  . fluticasone (FLOVENT HFA) 44 MCG/ACT inhaler 2 puff  2 puff Inhalation BID Doctor Chlconversion, MD   2 puff at 09/03/14 0918  . lithium carbonate (LITHOBID) CR tablet 300 mg  300 mg Oral Daily Doctor Chlconversion, MD   300 mg at 09/03/14 0916  . lithium carbonate (LITHOBID) CR tablet 600 mg  600 mg Oral QHS Doctor Chlconversion, MD   600 mg at 09/02/14 2146  . LORazepam (ATIVAN) tablet 2 mg  2 mg Oral Q6H PRN Jimmy FootmanAndrea Hernandez-Gonzalez, MD   2 mg at 09/03/14 1251  . methylphenidate (RITALIN) tablet 5 mg  5 mg Oral 2 times per day Doctor Chlconversion, MD   5 mg at 09/03/14 0916  . metoprolol succinate (TOPROL-XL) 24 hr tablet 25 mg  25 mg Oral Daily Doctor Chlconversion, MD   25 mg at 09/03/14 0917  . nicotine (NICODERM CQ - dosed in mg/24 hours) patch 14 mg  14 mg Transdermal Daily Doctor Chlconversion, MD   14 mg at 09/03/14 0918  . nicotine (NICOTROL) 10 MG inhaler 1 continuous puffing  1 continuous puffing Inhalation PRN Doctor Chlconversion, MD   1 continuous puffing at 09/02/14 0950  . senna (SENOKOT) tablet 17.2 mg  2 tablet Oral QHS Doctor Chlconversion, MD   17.2 mg at 09/02/14 2146    Lab Results:  Results for orders placed or performed during the hospital encounter of 08/22/14 (from the past 48 hour(s))  CBC with Differential/Platelet     Status: Abnormal   Collection Time: 09/02/14  6:53 AM  Result Value Ref Range   WBC 14.6 (H) 3.8 - 10.6 K/uL   RBC 4.66 4.40 - 5.90 MIL/uL   Hemoglobin 14.3 13.0 - 18.0 g/dL    HCT 16.143.0 09.640.0 - 04.552.0 %   MCV 92.3 80.0 - 100.0 fL   MCH 30.7 26.0 - 34.0 pg   MCHC 33.2 32.0 - 36.0 g/dL   RDW 40.914.2 81.111.5 - 91.414.5 %   Platelets 264 150 - 440 K/uL   Neutrophils Relative % 68 %   Lymphocytes Relative 20 %   Monocytes Relative 8 %   Eosinophils Relative 3 %   Basophils Relative 1 %   Neutro Abs 10.0 (H) 1.4 - 6.5 K/uL   Lymphs Abs 2.9 1.0 - 3.6 K/uL   Monocytes Absolute 1.2 (H) 0.2 - 1.0 K/uL  Eosinophils Absolute 0.4 0 - 0.7 K/uL   Basophils Absolute 0.1 0 - 0.1 K/uL  Lithium level     Status: Abnormal   Collection Time: 09/03/14  6:45 AM  Result Value Ref Range   Lithium Lvl 0.58 (L) 0.60 - 1.20 mmol/L    Physical Findings: AIMS:  , ,  ,  ,    CIWA:    COWS:     Treatment Plan Summary: Daily contact with patient to assess and evaluate symptoms and progress in treatment and Medication management Schizoaffective disorder: -we will continue with Clozaril titration today I will increase the morning Clozaril to 150 mg by mouth every morning the evening Clozaril will be continued at 350 mg. He is still psychotic and delusional we'll continue with Clozaril titration.  -Continue lithium 300 mg by mouth every morning and 600 mg by mouth daily at bedtime for mood stabilization. Level today: 0.58  Agitation patient will receive Ativan 2 mg every 6 hours when necessary (started on 5/2 as pt agitated not responding to redirection)  Sedation and daytime drowsiness: continued ritalin 5 mg by mouth twice a day  Hypertension and tachycardia continue metoprolol 50 m.g  May increase again later this week as his pulse continued to be above 100  Tobacco use disorder continue Nicotrol inhaler in NicoDerm patch 14 mg daily  Constipation continue Colace 200 mg by mouth twice a day and Senokot 2 tablets at bedtime  COPD continue Flovent 2 pops daily  Hepatitis C at discharge will refer patient to [facility] care at [facility]   Medical Decision Making:   Established Problem, Stable/Improving (1)   I certify that the services received since the previous certification/recertification were and continue to be medically necessary as the treatment provided can be reasonably expected to improve the patient's condition; the medical record documents that the services furnished were intensive treatment services or their equivalent services, and this patient continues to need, on a daily basis, active treatment furnished directly by or requiring the supervision of inpatient psychiatric personnel.    Jimmy FootmanHernandez-Gonzalez,  Elyssa Pendelton 09/03/2014, 1:46 PM

## 2014-09-03 NOTE — Plan of Care (Signed)
Problem: Diagnosis: Increased Risk For Suicide Attempt Goal: LTG-Patient Will Show Positive Response to Medication LTG (by discharge) : Patient will show positive response to medication and will participate in the development of the discharge plan.  Outcome: Progressing Patient cooperative with meds and is planning to return to group home at discharge. Goal: LTG-Patient Will Report Improved Mood and Deny Suicidal LTG (by discharge) Patient will report improved mood and deny suicidal ideation.  Outcome: Progressing He is denying SI. States mood is less depressed and AH improved. Goal: LTG-Patient Will Report Improvement in Psychotic Symptoms LTG (by discharge) : Patient will report improvement in psychotic symptoms.  Outcome: Progressing Reports he is still having AH but they are improved. Goal: STG-Patient Will Attend All Groups On The Unit Outcome: Not Progressing Not attending groups Goal: STG-Patient Will Comply With Medication Regime Outcome: Progressing Taking meds as ordered  Problem: Ineffective individual coping Goal: STG: Patient will remain free from self harm Outcome: Progressing Has had not episodes of self-harm

## 2014-09-03 NOTE — Progress Notes (Signed)
Francis DowseJoel cooperative.He had good visit with his mom.He focused on changing dosage of ritalin.Instructed to talk to MD about med issues.Continues to say his arms are broke.Good range of motion.Med compliant.States he wants to go home so bad. He says he is no better.States he has been here 1 month.

## 2014-09-03 NOTE — Progress Notes (Signed)
Recreation Therapy Notes  Recreation Therapy Group Note Template    Date: [id number] Time: 3:00 pm Location: Craft Room  Group Topic: Coping Skills  Goal Area(s) Addresses:  Patient will identify things they are grateful for. Patient will identify how being grateful can influence decision making.  Behavioral Response: Attentive, Interactive  Intervention: Grateful Wheel  Activity: Patients were given an "I Am Grateful For" worksheet and instructed to list 2-3 things per each category that they are grateful for.  Education: LRT educated patients on leisure and why it is important to implement it into their schedules.  Education Outcome: Acknowledges education/In group clarification offered   Clinical Observations/Feedback: Patient completed approximately half of the worksheet. Patient listed at least one item per each category. Patient contributed to group discussion by stating things he was grateful for  Jacquelynn CreeGreene,Zaydan Papesh M, LRT/CTRS 09/03/2014 5:18 PM

## 2014-09-03 NOTE — [facility] Group Notes (Signed)
[facility] Group Notes:  (Nursing/MHT/Case Management/Adjunct)  Date:  09/03/2014  Time:  9:16 PM  Type of Therapy:  Group Therapy  Participation Level:  Active  Participation Quality:  started talking about shooting someone in the head.   Affect:  Not Congruent  Cognitive:  Alert  Insight:  Lacking  Engagement in Group: off topic  Modes of Intervention:  Education  Summary of Progress/Problems:  Roy MunroeClarence Melvin Kristoph Ewing 09/03/2014, 9:16 PM

## 2014-09-03 NOTE — Plan of Care (Signed)
Problem: [facility] Participation in Recreation Therapeutic Interventions Goal: STG-Patient will demonstrate improved self esteem by identif STG: Self-Esteem - Within 8 treatment sessions, patient will verbalize at least 5 positive affirmation statements in each of 5 treatment sessions to increase self-esteem post d/c.  Outcome: Progressing Treatment session 2; Completed 2 out of 5: At approximately 12:30 pm, LRT met with patient in patient room. Patient verbalized 5 positive affirmation statements. Patient stated, "I wanted to beat up 2 ladies, but I did not do it." Patient reported it felt "good" to say the positive affirmation statements. LRT encouraged patient to continue saying positive affirmation statements.   Leonette Monarch, LRT/CTRS 05.04.16 1:33 pm

## 2014-09-03 NOTE — Progress Notes (Signed)
LCSW met with patient and asked him how he was doing. He was able to say he was good but he was not able to stay focused on subjects, he went on to discuss his dot art and his mother and other topics appeared to change every 15 seconds and his sentences were unfinished. LCSW consulted with Dr [facility] PA student.

## 2014-09-03 NOTE — [facility] Group Notes (Signed)
Adult Psychoeducational Group Note  Date:  09/03/2014 Time:  2:28 PM  Group Topic/Focus:  Emotional Education:   The focus of this group is to discuss what feelings/emotions are, and how they are experienced.  Participation Level:  Minimal  Participation Quality:  Redirectable  Affect:  Irritable  Cognitive:  Disorganized and Delusional  Insight: None  Engagement in Group:  Distracting  Modes of Intervention:  Limit-setting, Problem-solving and Reality Testing  Additional Comments:  Pt was discussing delusions he has about an invisible person harrassing pt and was redirected several times. Pt states he wants to go home and MD is not cooperating with his wishes. Pt appears to not understand why he is in the hospital and facilitator asked pt to ask his MD, CSW, or RN specifically why he remains in the hospital for pt to get an explanation.   Beryl MeagerIngle, Hallelujah Wysong T 09/03/2014, 2:28 PM

## 2014-09-03 NOTE — [facility] Group Notes (Signed)
[facility]  09/03/2014 11:03 AM  Participation Quality:  Redirectable  Affect:  Anxious and Not Congruent  Cognitive:  Disorganized and Delusional  Insight:  None  Engagement in Group:  Distracting  Modes of Intervention:  Exploration and Reality Testing  Summary of Progress/Problems: CSW shared with group [facility] staff roles and explained LOS and discharge process to group members. Pt received a workbook for Wednesday on topic of "Personal Development". Pt shared that his SMART goal is to "get out of the hospital, feeling worse and worse". Pt still reports delusions that someone he cannot see is harrassing him.   Beryl MeagerIngle, Evalie Hargraves T 09/03/2014, 11:03 AM

## 2014-09-04 MED ORDER — METOPROLOL SUCCINATE ER 25 MG PO TB24
25.0000 mg | ORAL_TABLET | Freq: Once | ORAL | Status: DC
  Filled 2014-09-04: qty 1

## 2014-09-04 MED ORDER — METOPROLOL SUCCINATE ER 50 MG PO TB24
50.0000 mg | ORAL_TABLET | Freq: Every day | ORAL | Status: DC
  Administered 2014-09-05 – 2014-09-08 (×4): 50 mg via ORAL
  Filled 2014-09-04 (×4): qty 1

## 2014-09-04 MED ORDER — CLOZAPINE 100 MG PO TABS
200.0000 mg | ORAL_TABLET | Freq: Every day | ORAL | Status: DC
  Administered 2014-09-05 – 2014-09-08 (×4): 200 mg via ORAL
  Filled 2014-09-04 (×5): qty 2

## 2014-09-04 NOTE — Progress Notes (Signed)
Patient rates his depression a "7"; denies any SI and contracts for safety; admits to having mild anxiety/irritability at times; administered prn ativan X 1 with complete relief; continues to have AH, except patient states he cannot make out what the voices are saying now; no further voiced complaints; continue to monitor.

## 2014-09-04 NOTE — [facility] Group Notes (Signed)
[facility] Group Notes:  (Nursing/MHT/Case Management/Adjunct)  Date:  09/04/2014  Time:  11:50 AM  Type of Therapy:  Group Therapy  Participation Level:  None left early and came back  Participation Quality:  Inattentive  Affect:  Defensive  Cognitive:  Appropriate  Insight:  Appropriate  Engagement in Group:  Off Topic  Modes of Intervention:  Activity  Summary of Progress/Problems:  [name] 09/04/2014, 11:50 AM

## 2014-09-04 NOTE — Tx Team (Signed)
Interdisciplinary Treatment Plan Update (Adult)  Date:  09/04/2014 Time Reviewed:  8:02 AM  Progress in Treatment: Attending groups: Yes. Participating in groups:  Yes. Taking medication as prescribed:  Yes. Tolerating medication:  Yes. Family/Significant othe contact made:  Yes, individual(s) contacted:  Mother Patient understands diagnosis:  No. Discussing patient identified problems/goals with staff:  No. Medical problems stabilized or resolved:  Yes. Denies suicidal/homicidal ideation: Yes. Issues/concerns per patient self-inventory:  Yes. Other:  New problem(s) identified: TBD  Discharge Plan or Barriers: Patient is to go to [facility] and can return to his group home  Reason for Continuation of Hospitalization: Patient continues to have disorganized thoughts and speech  Comments:  Estimated length of stay:  New goal(s):  Review of initial/current patient goals per problem list:   TBD  Attendees: Patient:  [name] 5/5/20168:02 AM  Family:   5/5/20168:02 AM  Physician:  Dr Ardyth HarpsHernandez 5/5/20168:02 AM  Nursing:    5/5/20168:02 AM  Case Manager:   5/5/20168:02 AM  Counselor:   5/5/20168:02 AM  Oth Other: Arrie Senatelaudine Darcy Barbara LCSW 5/5/20167:57 AM  Other: Beryl MeagerJason Ingle LCSWA 5/5/20167:57 AM  Other: Jake SharkSara Laws LCSW 5/5/20167:57 AM  Other: Delorse LekLynn Washington LCSW 5/5/20167:57 AM  Other: Princella IonElizabeth Greene 5/5/20167:57 AM  Other: Sheppard PentonLee Ann Rousch 5/5/20167:57 AM  Other: Susy FrizzleMatt Hunter[employer] 5/5/20167:57 AM  Other: [name] -Peer Support 5/5/20167:57 AM       er:   5/5/20168:02 AM  Other:   5/5/20168:02 AM  Other:   5/5/20168:02 AM  Other:  5/5/20168:02 AM  Other:  5/5/20168:02 AM  Other:  5/5/20168:02 AM  Other:  5/5/20168:02 AM  Other:  5/5/20168:02 AM  Other:  5/5/20168:02 AM  Other:   5/5/20168:02 AM   Scribe for Treatment Team:   Cheron SchaumannBandi, Reilly Blades M, 09/04/2014, 8:02 AM

## 2014-09-04 NOTE — Plan of Care (Signed)
Problem: [facility] Participation in Recreation Therapeutic Interventions Goal: STG-Patient will demonstrate improved self esteem by identif STG: Self-Esteem - Within 8 treatment sessions, patient will verbalize at least 5 positive affirmation statements in each of 5 treatment sessions to increase self-esteem post d/c.  Outcome: Progressing Treatment session 3; completed 3 out of 5: At approximately 11:15 am, LRT met with patient in patient room. Patient verbalized 5 positive affirmation statements. Patient reported it felt "good". LRT encouraged patient to continue saying positive affirmation statements.

## 2014-09-04 NOTE — Tx Team (Signed)
Interdisciplinary Treatment Plan Update (Adult)  Date:  09/04/2014 Time Reviewed:  9:12 AM  Progress in Treatment: Attending groups: Yes. Participating in groups:  Yes. Taking medication as prescribed:  Yes. Tolerating medication:  Yes. Family/Significant othe contact made:  Yes, individual(s) contacted:  Mother Patient understands diagnosis:  No. Discussing patient identified problems/goals with staff:  No. Medical problems stabilized or resolved:  Yes. Denies suicidal/homicidal ideation: Yes. Issues/concerns per patient self-inventory:  Yes. Other:  New problem(s) identified: TBD  Discharge Plan or Barriers: Patient is to go to [facility] and can return to his group home  Reason for Continuation of Hospitalization: Patient continues to have disorganized thoughts and speech, delusional, responding to internal and external stimuli, aggitated,   Comments:   Estimated length of stay: 7 days  New goal(s):  Review of initial/current patient goals per problem list:   TBD  Attendees: Patient:  Roy NeerJoel O Ewing 5/5/20169:12 AM  Family:   5/5/20169:12 AM  Physician:  Dr Ardyth HarpsHernandez 5/5/20169:12 AM  Nursing:    5/5/20169:12 AM  Case Manager:   5/5/20169:12 AM  Counselor:   5/5/20169:12 AM  Oth Other: Arrie Senatelaudine Marvelene Stoneberg LCSW 5/5/20167:57 AM  Other: Beryl MeagerJason Ingle LCSWA 5/5/20167:57 AM  Other: Jake SharkSara Laws LCSW 5/5/20167:57 AM  Other: Delorse LekLynn Washington LCSW 5/5/20167:57 AM  Other: Princella IonElizabeth Greene 5/5/20167:57 AM  Other: Sheppard PentonLee Ann Rousch 5/5/20167:57 AM  Other: Susy FrizzleMatt [employer] 5/5/20167:57 AM  Other: [name] -Peer Support 5/5/20167:57 AM       er:   5/5/20169:12 AM  Other:   5/5/20169:12 AM  Other:   5/5/20169:12 AM  Other:  5/5/20169:12 AM  Other:  5/5/20169:12 AM  Other:  5/5/20169:12 AM  Other:  5/5/20169:12 AM  Other:  5/5/20169:12 AM  Other:  5/5/20169:12 AM  Other:   5/5/20169:12 AM   Scribe for Treatment Team:   Cheron SchaumannBandi, Hazelle Woollard M,  09/04/2014, 9:12 AM

## 2014-09-04 NOTE — Plan of Care (Signed)
Problem: Diagnosis: Increased Risk For Suicide Attempt Goal: LTG-Patient Will Show Positive Response to Medication LTG (by discharge) : Patient will show positive response to medication and will participate in the development of the discharge plan.  Outcome: Progressing Patient denies SI. Continues to report anxiety and poor impulse control. Goal: LTG-Patient Will Report Improvement in Psychotic Symptoms LTG (by discharge) : Patient will report improvement in psychotic symptoms.  Outcome: Progressing States AH have improved; still has them, but they are not as bothersome. Goal: STG-Patient Will Attend All Groups On The Unit Outcome: Not Progressing Not attending groups  Problem: Ineffective individual coping Goal: STG: Patient will remain free from self harm Outcome: Progressing No evidence of self-harm. Goal: STG:Pt. will utilize relaxation techniques to reduce stress STG: Patient will utilize relaxation techniques to reduce stress levels  Outcome: Not Progressing Patient unable to follow instructions for techniques Goal: STG: Patient will participate in after care plan Outcome: Progressing Plans to return to group home

## 2014-09-04 NOTE — Plan of Care (Signed)
Problem: Diagnosis: Increased Risk For Suicide Attempt Goal: LTG-Patient Will Report Improved Mood and Deny Suicidal LTG (by discharge) Patient will report improved mood and deny suicidal ideation.  Outcome: Progressing Patient denies any thoughts of suicide.

## 2014-09-04 NOTE — [facility] Group Notes (Signed)
[facility] Group Notes:  (Nursing/MHT/Case Management/Adjunct)  Date:  09/04/2014  Time:  9:11 AM  Type of Therapy:  Community Meeting   Participation Level:  Did Not Attend  Participation Quality:  None  Affect:  None  Cognitive:  None  Insight:  None  Engagement in Group:  None  Modes of Intervention:  None  Summary of Progress/Problems:  Magali Bray De'Chelle Jazen Spraggins 09/04/2014, 9:11 AM

## 2014-09-04 NOTE — Progress Notes (Signed)
Recreation Therapy Notes  Date: 05.05.16 Time: 3:00 pm Location: Craft Room  Group Topic: Leisure Education  Goal Area(s) Addresses:  Increase leisure awareness.  Behavioral Response: Reserved/Withdrawn, Left Early  Intervention: Leisure Time  Activity: Patients were instructed to write two healthy leisure activities on slips of paper. Patients were then given a Leisure Time Clock worksheet and instructed to fill out the worksheet. Patients were instructed to list 10 positive emotions. Patient matched their leisure activities to the positive emotions.  Education:LRT educated patients on leisure and how to use it as a Associate Professorcoping skill.  Education Outcome: Acknowledges education/In group clarification offered  Clinical Observations/Feedback: Patient wrote two positive leisure activities. Patient did not complete the Leisure Time Clock worksheet. Patient left group at approximately 3:20 pm. Patient did not return to group.    Jacquelynn CreeGreene,Cheyla Duchemin M, LRT/CTRS 09/04/2014 5:25 PM

## 2014-09-05 MED ORDER — METFORMIN HCL 500 MG PO TABS
500.0000 mg | ORAL_TABLET | Freq: Two times a day (BID) | ORAL | Status: DC
  Administered 2014-09-05 – 2014-09-08 (×6): 500 mg via ORAL
  Filled 2014-09-05 (×6): qty 1

## 2014-09-05 NOTE — [facility] Group Notes (Signed)
[facility] LCSW Group Therapy  09/05/2014 9:35 AM  Type of Therapy:  Psychoeducational Skills  Participation Level:  Minimal  Participation Quality:  Attentive  Affect:  Labile  Cognitive:  Disorganized and Delusional  Insight:  Lacking  Engagement in Therapy:  Distracting  Modes of Intervention:  Discussion, Reality Testing and Socialization  Summary of Progress/Problems: Pt participated in group but was distracting due to pt's delusions of someone invisible harassing pt. Pt left group early after 25 minutes of group therapy.  [name], [name] T 09/05/2014, 9:35 AM

## 2014-09-05 NOTE — Progress Notes (Signed)
RD Assessment- Length of Stay  Admitted with: Schizophrenia PMHx: Reviewed  Current Diet: Regular Typical Food/ Fluid Intake: 50-100% of most meals recorded per I/O since admission Meal/ Snack Patterns: Unable to assess  Supplements: None  Food Allergies: NKFA Food Preferences: Reviewed  Ht: 68" Current weight: 201 BMI: 30.6 Weight Changes: No weight changes noted per chart  UOP: Reviewed Digestive: Reviewed Gastrointestinal: Reviewed Skin: Reviewed Physical Findings: n/a  Labs:Reviewed  Meds: Reviewed  PES Statement: No nutrition concerns at this time.  Diagnosis:  Intervention: Meals and Snacks: Cater to patient preferences  Monitoring/ Evaluation: Energy Intake: goal for patient to meet >90% of estimated needs.   LOW Care Level

## 2014-09-05 NOTE — Progress Notes (Signed)
[facility] MD Progress Note  09/05/2014 3:51 PM [name]  MRN:  [id number] Subjective:  Patient was seen today he was somewhat calmer. He continues to report hearing voices that are telling him that medication is been putting in his drink. Today he said a voice told him that there was tramadol in his drink so he did not finish finish it all.  Marland Kitchen He also complained today of being irritated and asked to fight by other patients ((which is not true). The patient continues to tell the nurse that he needs "uppers" and sometimes "downers" and pain medications for all his broken bones.  Conversation with him is not reality based as he continues to be delusional. Today he denies suicidality homicidality or visual hallucinations. He denies major problems with sleep appetite energy or concentration.  Principal Problem: Schizoaffective disorder, bipolar type Diagnosis:   Patient Active Problem List   Diagnosis Date Noted  . Schizoaffective disorder, bipolar type [F25.0] 08/31/2014  . Obesity [E66.9] 08/31/2014  . HTN (hypertension) [I10] 08/31/2014  . COPD (chronic obstructive pulmonary disease) [J44.9] 08/31/2014  . Sinus tachycardia [I47.1] 08/31/2014  . Constipation [K59.00] 08/31/2014  . Hepatitis C [B19.20] 08/31/2014   Total Time spent with patient: 30 minutes   Past Medical History: No past medical history on file.  Past Surgical History  Procedure Laterality Date  . Right knee -patela operation     Family History: No family history on file. Social History:  History  Alcohol Use No     History  Drug Use  . Yes  . Special: Marijuana    Comment: in remission    History   Social History  . Marital Status: Single    Spouse Name: N/A  . Number of Children: N/A  . Years of Education: N/A   Social History Main Topics  . Smoking status: Current Every Day Smoker  . Smokeless tobacco: Not on file  . Alcohol Use: No  . Drug Use: Yes    Special: Marijuana     Comment: in remission  .  Sexual Activity: Not on file   Other Topics Concern  . Not on file   Social History Narrative  . No narrative on file   Additional History:    Sleep: Good  Appetite:  Good   Assessment:   Musculoskeletal: Strength & Muscle Tone: within normal limits Gait & Station: normal Patient leans: N/A   Psychiatric Specialty Exam: Physical Exam   Review of Systems  Respiratory: Negative for cough.   Cardiovascular: Negative for chest pain.  Gastrointestinal: Negative for nausea, vomiting, abdominal pain, diarrhea and constipation.  Musculoskeletal: Positive for joint pain.  Neurological: Positive for headaches.  Psychiatric/Behavioral: Positive for hallucinations. Negative for depression, suicidal ideas and substance abuse. The patient has insomnia. The patient is not nervous/anxious.     Blood pressure 125/80, pulse 99, temperature 98 F (36.7 C), temperature source Oral, resp. rate 20, height 5' 7.99" (1.727 m), weight 91.2 kg (201 lb 1 oz).Body mass index is 30.58 kg/(m^2).  General Appearance: Disheveled  Eye Contact::  Good  Speech:  Clear and Coherent  Volume:  Normal  Mood:  Irritable  Affect:  Constricted  Thought Process:  Tangential  Orientation:  Full (Time, Place, and Person)  Thought Content:  Delusions and Hallucinations: Auditory  Suicidal Thoughts:  No  Homicidal Thoughts:  No  Memory:  Immediate;   Good Recent;   Good Remote;   Good  Judgement:  Poor  Insight:  Lacking  Psychomotor Activity:  Normal  Concentration:  Fair  Recall:  Good  Fund of Knowledge:Good  Language: Good  Akathisia:  No  Handed:  Right  AIMS (if indicated):     Assets:  Financial Resources/Insurance Housing Social Support  ADL's:  Intact  Cognition: WNL  Sleep:  Number of Hours: 8.45     Current Medications: Current Facility-Administered Medications  Medication Dose Route Frequency Provider Last Rate Last Dose  . acetaminophen (TYLENOL) tablet 650 mg  650 mg Oral Q4H  PRN Doctor Chlconversion, MD      . alum & mag hydroxide-simeth (MAALOX/MYLANTA) 200-200-20 MG/5ML suspension 30 mL  30 mL Oral Q4H PRN Doctor Chlconversion, MD   30 mL at 09/01/14 1631  . cloZAPine (CLOZARIL) tablet 200 mg  200 mg Oral Daily Jimmy FootmanAndrea Hernandez-Gonzalez, MD   200 mg at 09/05/14 0904  . cloZAPine (CLOZARIL) tablet 350 mg  350 mg Oral QHS Doctor Chlconversion, MD   350 mg at 09/04/14 2032  . docusate sodium (COLACE) capsule 200 mg  200 mg Oral BID Doctor Chlconversion, MD   200 mg at 09/05/14 0904  . fluticasone (FLOVENT HFA) 44 MCG/ACT inhaler 2 puff  2 puff Inhalation BID Doctor Chlconversion, MD   2 puff at 09/05/14 0813  . lithium carbonate (LITHOBID) CR tablet 300 mg  300 mg Oral Daily Doctor Chlconversion, MD   300 mg at 09/05/14 0903  . lithium carbonate (LITHOBID) CR tablet 600 mg  600 mg Oral QHS Doctor Chlconversion, MD   600 mg at 09/04/14 2031  . LORazepam (ATIVAN) tablet 2 mg  2 mg Oral Q6H PRN Jimmy FootmanAndrea Hernandez-Gonzalez, MD   2 mg at 09/04/14 1841  . metFORMIN (GLUCOPHAGE) tablet 500 mg  500 mg Oral BID WC Jimmy FootmanAndrea Hernandez-Gonzalez, MD      . methylphenidate (RITALIN) tablet 5 mg  5 mg Oral 2 times per day Doctor Chlconversion, MD   5 mg at 09/05/14 1312  . metoprolol succinate (TOPROL-XL) 24 hr tablet 50 mg  50 mg Oral Daily Jimmy FootmanAndrea Hernandez-Gonzalez, MD   50 mg at 09/05/14 0904  . nicotine (NICODERM CQ - dosed in mg/24 hours) patch 14 mg  14 mg Transdermal Daily Doctor Chlconversion, MD   14 mg at 09/05/14 0903  . nicotine (NICOTROL) 10 MG inhaler 1 continuous puffing  1 continuous puffing Inhalation PRN Doctor Chlconversion, MD   1 continuous puffing at 09/02/14 0950  . senna (SENOKOT) tablet 17.2 mg  2 tablet Oral QHS Doctor Chlconversion, MD   17.2 mg at 09/04/14 2031    Lab Results:  No results found for this or any previous visit (from the past 48 hour(s)).  Physical Findings: AIMS:  , ,  ,  ,    CIWA:    COWS:     Treatment Plan Summary: Daily contact with  patient to assess and evaluate symptoms and progress in treatment and Medication management Schizoaffective disorder: -Continue Clozaril 200 mg by mouth every morning and 350 mg by mouth daily at bedtime. Dose will not be increased further until we obtain a Clozaril level.  We will check Clozaril level on Sunday on Sunday. -Continue lithium 300 mg by mouth every morning and 600 mg by mouth daily at bedtime for mood stabilization. Level 5/4: 0.58  Agitation patient will receive Ativan 2 mg every 6 hours when necessary (started on 5/2 as pt agitated not responding to redirection)  Sedation and daytime drowsiness: continued ritalin 5 mg by mouth twice a day.  Today the patient's  mother called and reported having concerns about having the patient on ritalin as ritalin has caused worsening psychosis. Number [phone] Claire ShownDebra Chipley I gave her a call today but unable to reach her and left a message in her voicemail.  Hypertension and tachycardia continue metoprolol 50 m.g  /day  Tobacco use disorder continue Nicotrol inhaler in NicoDerm patch 14 mg daily  Constipation continue Colace 200 mg by mouth twice a day and Senokot 2 tablets at bedtime  COPD continue Flovent 2 pops daily  Hepatitis C at discharge will refer patient to [facility]   Medical Decision Making:  Established Problem, Stable/Improving (1)   I certify that the services received since the previous certification/recertification were and continue to be medically necessary as the treatment provided can be reasonably expected to improve the patient's condition; the medical record documents that the services furnished were intensive treatment services or their equivalent services, and this patient continues to need, on a daily basis, active treatment furnished directly by or requiring the supervision of inpatient psychiatric personnel.    Jimmy FootmanHernandez-Gonzalez,  Tekla Malachowski 09/05/2014, 3:51 PM

## 2014-09-05 NOTE — Progress Notes (Signed)
Patient continues to experience paranoid delusional thoughts - specifically that all his bones are broken, and that his physician is "keeping me forever and making things worse." He has poor response to reality orientation. He frequently requests that he needs additional Ritalin and Ativan and says  he not getting the "right" medication.  He is unable to tolerate groups. No noted interaction with peers although he does spend time in the milieu. Will continue to monitor mood, mental status, reality orientation as tolerated.

## 2014-09-05 NOTE — Plan of Care (Signed)
Problem: [facility] Participation in Recreation Therapeutic Interventions Goal: STG-Patient will demonstrate improved self esteem by identif STG: Self-Esteem - Within 8 treatment sessions, patient will verbalize at least 5 positive affirmation statements in each of 5 treatment sessions to increase self-esteem post d/c.  Outcome: Progressing Treatment session 4; Completed 4 out of 5: At approximately 11:15 am, LRT met with patient in patient room. Patient verbalized 5 positive affirmation statements. Patient reported it felt "pretty good". LRT encouraged patient to continue saying positive affirmation statements.  Leonette Monarch, LRT/CTRS 05.06.16 12:03 pm

## 2014-09-05 NOTE — [facility] Group Notes (Signed)
Adult Psychoeducational Group Note  Date:  09/05/2014 Time:  2:26 PM  Group Topic/Focus:  Relapse Prevention Planning:   The focus of this group is to define relapse and discuss the need for planning to combat relapse.  Participation Level:  Minimal  Participation Quality:  Redirectable  Affect:  Blunted  Cognitive:  Disorganized  Insight: Lacking  Engagement in Group:  Limited  Modes of Intervention:  Socialization  Additional Comments:  Pt came to group and shared that he gets joy from "keeping in shape and ride while I listen to radio or CDs". Pt left group early and was angry he is not discharging till Monday and states he feels that MD is suppose to discharge him already.   Beryl MeagerIngle, Marcile Fuquay T 09/05/2014, 2:26 PM

## 2014-09-05 NOTE — Progress Notes (Signed)
Recreation Therapy Notes  Date: 05.06.16 Time: 3:00 pm Location: Craft Room  Group Topic: Communication, Problem solving, and Teamwork  Goal Area(s) Addresses:  Patient will effectively work with peer towards shared goal. Patient will identify skill used to make activity successful. Patient will identify how skills used during activity can be used to reach post d/c goals.  Behavioral Response: Reserved/Withdrawn, Left Early  Intervention: Life Boat  Activity: Patients were given a list of 16 people (President Obama, [employer], pregnant woman, etc.). Patients were instructed to pick 8 people to go on a nicer life boat with the group and the other 8 would go on an inflatable boat.  Education:LRT educated patient on healthy support systems and why they are important.  Education Outcome: Acknowledges education/In group clarification offered  Clinical Observations/Feedback: Patient did not participate in group activity. Patient left group at approximately 3:17 pm. Patient did not contribute to group discussion.   Jacquelynn CreeGreene,Tor Tsuda M, LRT/CTRS 09/05/2014 4:42 PM

## 2014-09-05 NOTE — [facility] Group Notes (Signed)
[facility] Group Notes:  (Nursing/MHT/Case Management/Adjunct)  Date:  09/05/2014  Time:  2:18 PM  Type of Therapy:  Group Therapy  Participation Level:  Did Not Attend  Summary of Progress/Problems:  Mirka Barbone De'Chelle Lafreda Casebeer 09/05/2014, 2:18 PM

## 2014-09-05 NOTE — Progress Notes (Signed)
Pt remains agitated and irritable. States he's ready to go home and find a job. Scheduled medications given as ordered. Tolerate well. Described depression 7 and anxiety level 8 (0low-10 high). No voiced thoughts of hurting himself. Denies AV/H. No c/o pain/discomfort noted.

## 2014-09-05 NOTE — [facility] Group Notes (Signed)
[facility]  09/05/2014 11:33 AM  Participation Quality:  Attentive and off topic  Affect:  Anxious  Cognitive:  Disorganized and Confused  Insight:  Distracting and Lacking  Engagement in Group:  Distracting and Off Topic  Modes of Intervention:  Confrontation, Dance movement psychotherapisteality Testing and Socialization  Summary of Progress/Problems: Pt attended group and shared that his SMART goal is to "get out of here". Pt was not able to stay on topic and is distracting to other group members as pt is delusional.   Beryl Meagerngle, Jarrett Albor T 09/05/2014, 11:33 AM

## 2014-09-05 NOTE — Progress Notes (Signed)
[facility]  09/05/2014 4:00 PM Roy NeerJoel O Ewing  MRN:  [id number] Subjective:  Today patient denies having major problems or concerns. Today he denies fighting with anybody or hearing voices.  Denies problems with his mood appetite energy or concentration. Denies having suicidal thoughts or homicidal thoughts.    Denies any  side effects.  Denies having any physical complaints, other than the pain in his joints (constantly complaining of having broken bones secondary to "fighting 7000 people).  He continues to be focused on getting more ritalin as he thinks with that medication he can go out and find again a job.  He perseverates on getting a higher dose of 40 mg ritalin LA. As he has been very agitated every time discharge is brought up this issue was not discussed with the patient today. The patient continues to be agitated about it and has been constantly talking and this writer's office door demanding to be seen.  Principal Problem: Schizoaffective disorder, bipolar type Diagnosis:   Patient Active Problem List   Diagnosis Date Noted  . Schizoaffective disorder, bipolar type [F25.0] 08/31/2014  . Obesity [E66.9] 08/31/2014  . HTN (hypertension) [I10] 08/31/2014  . COPD (chronic obstructive pulmonary disease) [J44.9] 08/31/2014  . Sinus tachycardia [I47.1] 08/31/2014  . Constipation [K59.00] 08/31/2014  . Hepatitis C [B19.20] 08/31/2014   Total Time spent with patient: 30 minutes   Past Medical History: No past medical history on file.  Past Surgical History  Procedure Laterality Date  . Right knee -patela operation     Family History: No family history on file. Social History:  History  Alcohol Use No     History  Drug Use  . Yes  . Special: Marijuana    Comment: in remission    History   Social History  . Marital Status: Single    Spouse Name: N/A  . Number of Children: N/A  . Years of Education: N/A   Social History Main Topics  . Smoking status: Current  Every Day Smoker  . Smokeless tobacco: Not on file  . Alcohol Use: No  . Drug Use: Yes    Special: Marijuana     Comment: in remission  . Sexual Activity: Not on file   Other Topics Concern  . Not on file   Social History Narrative  . No narrative on file   Additional History:    Sleep: Good  Appetite:  Good   Assessment:   Musculoskeletal: Strength & Muscle Tone: within normal limits Gait & Station: normal Patient leans: N/A   Psychiatric Specialty Exam: Physical Exam   Review of Systems  Respiratory: Negative for cough.   Cardiovascular: Negative for chest pain.  Gastrointestinal: Negative for nausea, vomiting, abdominal pain, diarrhea and constipation.  Musculoskeletal: Negative for myalgias, back pain, joint pain and neck pain.  Neurological: Negative for headaches.  Psychiatric/Behavioral: Positive for depression and hallucinations. Negative for suicidal ideas and substance abuse. The patient is not nervous/anxious and does not have insomnia.     Blood pressure 125/80, pulse 99, temperature 98 F (36.7 C), temperature source Oral, resp. rate 20, height 5' 7.99" (1.727 m), weight 91.2 kg (201 lb 1 oz).Body mass index is 30.58 kg/(m^2).  General Appearance: Disheveled  Eye Contact::  Good  Speech:  Clear and Coherent  Volume:  Normal  Mood:  Irritable  Affect:  Constricted  Thought Process:  Tangential  Orientation:  Full (Time, Place, and Person)  Thought Content:  Delusions and Hallucinations: Auditory  Suicidal Thoughts:  No  Homicidal Thoughts:  No  Memory:  Immediate;   Good Recent;   Good Remote;   Good  Judgement:  Poor  Insight:  Lacking  Psychomotor Activity:  Normal  Concentration:  Fair  Recall:  Good  Fund of Knowledge:Good  Language: Good  Akathisia:  No  Handed:  Right  AIMS (if indicated):     Assets:  Financial Resources/Insurance Housing Social Support  ADL's:  Intact  Cognition: WNL  Sleep:  Number of Hours: 8.45      Current Medications: Current Facility-Administered Medications  Medication Dose Route Frequency Provider Last Rate Last Dose  . acetaminophen (TYLENOL) tablet 650 mg  650 mg Oral Q4H PRN Doctor Chlconversion, MD      . alum & mag hydroxide-simeth (MAALOX/MYLANTA) 200-200-20 MG/5ML suspension 30 mL  30 mL Oral Q4H PRN Doctor Chlconversion, MD   30 mL at 09/01/14 1631  . cloZAPine (CLOZARIL) tablet 200 mg  200 mg Oral Daily Jimmy Footman, MD   200 mg at 09/05/14 0904  . cloZAPine (CLOZARIL) tablet 350 mg  350 mg Oral QHS Doctor Chlconversion, MD   350 mg at 09/04/14 2032  . docusate sodium (COLACE) capsule 200 mg  200 mg Oral BID Doctor Chlconversion, MD   200 mg at 09/05/14 0904  . fluticasone (FLOVENT HFA) 44 MCG/ACT inhaler 2 puff  2 puff Inhalation BID Doctor Chlconversion, MD   2 puff at 09/05/14 0813  . lithium carbonate (LITHOBID) CR tablet 300 mg  300 mg Oral Daily Doctor Chlconversion, MD   300 mg at 09/05/14 0903  . lithium carbonate (LITHOBID) CR tablet 600 mg  600 mg Oral QHS Doctor Chlconversion, MD   600 mg at 09/04/14 2031  . LORazepam (ATIVAN) tablet 2 mg  2 mg Oral Q6H PRN Jimmy Footman, MD   2 mg at 09/04/14 1841  . metFORMIN (GLUCOPHAGE) tablet 500 mg  500 mg Oral BID WC Jimmy Footman, MD      . methylphenidate (RITALIN) tablet 5 mg  5 mg Oral 2 times per day Doctor Chlconversion, MD   5 mg at 09/05/14 1312  . metoprolol succinate (TOPROL-XL) 24 hr tablet 50 mg  50 mg Oral Daily Jimmy Footman, MD   50 mg at 09/05/14 0904  . nicotine (NICODERM CQ - dosed in mg/24 hours) patch 14 mg  14 mg Transdermal Daily Doctor Chlconversion, MD   14 mg at 09/05/14 0903  . nicotine (NICOTROL) 10 MG inhaler 1 continuous puffing  1 continuous puffing Inhalation PRN Doctor Chlconversion, MD   1 continuous puffing at 09/02/14 0950  . senna (SENOKOT) tablet 17.2 mg  2 tablet Oral QHS Doctor Chlconversion, MD   17.2 mg at 09/04/14 2031    Lab  Results:  No results found for this or any previous visit (from the past 48 hour(s)).  Physical Findings: AIMS:  , ,  ,  ,    CIWA:    COWS:     Treatment Plan Summary: Daily contact with patient to assess and evaluate symptoms and progress in treatment and Medication management Schizoaffective disorder: -we will continue with Clozaril titration today I will increase the morning Clozaril to 200 mg by mouth every morning the evening Clozaril will be continued at 350 mg. He is still psychotic and delusional we'll continue with Clozaril titration.  -Continue lithium 300 mg by mouth every morning and 600 mg by mouth daily at bedtime for mood stabilization. Level today: 0.58  Agitation patient will receive  Ativan 2 mg every 6 hours when necessary (started on 5/2 as pt agitated not responding to redirection)  Sedation and daytime drowsiness: continued ritalin 5 mg by mouth twice a day  Hypertension and tachycardia continue metoprolol but we'll increase to 50 m.g    Tobacco use disorder continue Nicotrol inhaler in NicoDerm patch 14 mg daily  Constipation continue Colace 200 mg by mouth twice a day and Senokot 2 tablets at bedtime  COPD continue Flovent 2 pops daily  Hepatitis C at discharge will refer patient to [facility]   Medical Decision Making:  Established Problem, Stable/Improving (1)   I certify that the services received since the previous certification/recertification were and continue to be medically necessary as the treatment provided can be reasonably expected to improve the patient's condition; the medical record documents that the services furnished were intensive treatment services or their equivalent services, and this patient continues to need, on a daily basis, active treatment furnished directly by or requiring the supervision of inpatient psychiatric personnel.    Jimmy FootmanHernandez-Gonzalez,  Aliveah Gallant 09/05/2014, 4:00 PM

## 2014-09-05 NOTE — [facility] Group Notes (Signed)
[facility] Group Notes:  (Nursing/MHT/Case Management/Adjunct)  Date:  09/05/2014  Time:  9:21 PM  Type of Therapy:  Group Therapy  Participation Level:  Active  Participation Quality:  Appropriate  Affect:  Appropriate  Cognitive:  Appropriate  Insight:  Improving  Engagement in Group:  Improving  Modes of Intervention:  Education  Summary of Progress/Problems:  Roy MunroeClarence Ewing [name] 09/05/2014, 9:21 PM

## 2014-09-06 LAB — LITHIUM LEVEL: LITHIUM LVL: 0.6 mmol/L (ref 0.60–1.20)

## 2014-09-06 NOTE — [facility] Group Notes (Signed)
[facility] Group Notes:  (Nursing/MHT/Case Management/Adjunct)  Date:  09/06/2014  Time:  11:44 PM  Type of Therapy:  Group Therapy  Participation Level:  Minimal  Participation Quality:  Appropriate  Affect:  Appropriate  Cognitive:  Alert and Appropriate  Insight:  Improving  Engagement in Group:  Improving  Modes of Intervention:  Wrap-up Group  Summary of Progress/Problems:  Roy MorrowChelsea Ewing [name] 09/06/2014, 11:44 PM

## 2014-09-06 NOTE — Plan of Care (Signed)
Problem: Diagnosis: Increased Risk For Suicide Attempt Goal: STG-Patient Will Comply With Medication Regime Outcome: Progressing Pt. Takes meds. As prescribed.

## 2014-09-06 NOTE — Plan of Care (Signed)
Problem: Diagnosis: Increased Risk For Suicide Attempt Goal: LTG-Patient Will Show Positive Response to Medication LTG (by discharge) : Patient will show positive response to medication and will participate in the development of the discharge plan.  Outcome: Progressing Less anxious after Ativan Goal: LTG-Patient Will Report Improved Mood and Deny Suicidal LTG (by discharge) Patient will report improved mood and deny suicidal ideation.  Outcome: Progressing Denies SI Goal: LTG-Patient Will Report Improvement in Psychotic Symptoms LTG (by discharge) : Patient will report improvement in psychotic symptoms.  Outcome: Progressing Denies feeling controlled by AVH. Perseveration on meds and Diabetes Goal: STG-Patient Will Attend All Groups On The Unit Outcome: Progressing Attended group Goal: STG-Patient Will Report Suicidal Feelings to Staff Outcome: Progressing Denies SI Goal: STG-Patient Will Comply With Medication Regime Outcome: Progressing Med compliant Goal: STG-Patient will be able to identify reason for suicidal STG-Patient will be able to identify reason for suicidal ideation  Outcome: Progressing Denies SI  Problem: Ineffective individual coping Goal: LTG: Patient will report a decrease in negative feelings Outcome: Progressing Processing feelings openly Goal: STG: Pt will be able to identify effective and ineffective STG: Pt will be able to identify effective and ineffective coping patterns  Outcome: Progressing Redirectable Goal: STG: Patient will remain free from self harm Outcome: Progressing No self harm Goal: STG:Pt. will utilize relaxation techniques to reduce stress STG: Patient will utilize relaxation techniques to reduce stress levels  Outcome: Progressing Requested medication, talked to peers

## 2014-09-06 NOTE — [facility] Group Notes (Signed)
[facility] Group Notes:  (Nursing/MHT/Case Management/Adjunct)  Date:  09/06/2014  Time:  9:09 AM  Type of Therapy:  Group Therapy  Participation Level:  Did Not Attend  Participation Quality:  n/a  Affect:  n/a  Cognitive:  n/a  Insight:  None  Engagement in Group:  n/a  Modes of Intervention:  n/a  Summary of Progress/Problems:  Roy MunroeClarence Ewing [name] 09/06/2014, 9:09 AM

## 2014-09-06 NOTE — Outcomes Assessment (Signed)
Perseveration about discharge date, and states frustration about being admitted for so long. Blood sugar was 110 and he has the impression he has diabetes. Made multiple phone calls to express anger over "being given Diabetes at the hospital because he was here too long". Multiple attempts to educate and redirect. Interactive with peers and did allow for some redirection after education from this Clinical research associatewriter. Overall pleasant despite being intrusive. Denies current SI, HI, endorses baseline AVH.

## 2014-09-06 NOTE — Progress Notes (Signed)
Pt. Denies depression, SI/HI, A/V hallucinations.  States he is ready to leave and wants to go home.

## 2014-09-06 NOTE — Plan of Care (Signed)
Problem: Diagnosis: Increased Risk For Suicide Attempt Goal: STG-Patient Will Report Suicidal Feelings to Staff Outcome: Progressing Pt. Denying suicidal thoughts today.

## 2014-09-06 NOTE — Progress Notes (Signed)
[facility]  09/06/2014 11:50 AM [name]  MRN:  [id number] Subjective:  Patient was seen today he was calmer and less hostile. He was only semicooperative during the assessment as he was lying in bed trying to fall asleep. He complained that people were waking him up every time he wanted to go to sleep.  He denied having suicidality homicidality or auditory or visual hallucinations. He denied having any physical complaints. He denied having any side effects from his medications. He denied having problems with appetite energy or concentration.  Per nursing he continues to be delusional, thinking his bones are broken and that medications are being injected to him.  He has not been seeing recently fighting in the air.    Principal Problem: Schizoaffective disorder, bipolar type Diagnosis:   Patient Active Problem List   Diagnosis Date Noted  . Schizoaffective disorder, bipolar type [F25.0] 08/31/2014  . Obesity [E66.9] 08/31/2014  . HTN (hypertension) [I10] 08/31/2014  . COPD (chronic obstructive pulmonary disease) [J44.9] 08/31/2014  . Sinus tachycardia [I47.1] 08/31/2014  . Constipation [K59.00] 08/31/2014  . Hepatitis C [B19.20] 08/31/2014   Total Time spent with patient: 30 minutes   Past Medical History: No past medical history on file.  Past Surgical History  Procedure Laterality Date  . Right knee -patela operation     Family History: No family history on file. Social History:  History  Alcohol Use No     History  Drug Use  . Yes  . Special: Marijuana    Comment: in remission    History   Social History  . Marital Status: Single    Spouse Name: N/A  . Number of Children: N/A  . Years of Education: N/A   Social History Main Topics  . Smoking status: Current Every Day Smoker  . Smokeless tobacco: Not on file  . Alcohol Use: No  . Drug Use: Yes    Special: Marijuana     Comment: in remission  . Sexual Activity: Not on file   Other Topics Concern   . Not on file   Social History Narrative  . No narrative on file   Additional History:    Sleep: Good  Appetite:  Good   Assessment:   Musculoskeletal: Strength & Muscle Tone: within normal limits Gait & Station: normal Patient leans: N/A   Psychiatric Specialty Exam: Physical Exam   Review of Systems  Respiratory: Negative for cough.   Cardiovascular: Negative for chest pain.  Gastrointestinal: Negative for nausea, vomiting and abdominal pain.  Musculoskeletal: Positive for joint pain.  Neurological: Negative for headaches.  Psychiatric/Behavioral: Negative for depression, suicidal ideas, hallucinations and substance abuse. The patient is not nervous/anxious and does not have insomnia.     Blood pressure 123/82, pulse 89, temperature 98.2 F (36.8 C), temperature source Oral, resp. rate 20, height 5' 7.99" (1.727 m), weight 91.2 kg (201 lb 1 oz).Body mass index is 30.58 kg/(m^2).  General Appearance: Disheveled  Eye Contact::  Good  Speech:  Clear and Coherent  Volume:  Normal  Mood:  Irritable  Affect:  Constricted  Thought Process:  Tangential  Orientation:  Full (Time, Place, and Person)  Thought Content:  Delusions and Hallucinations: Auditory  Suicidal Thoughts:  No  Homicidal Thoughts:  No  Memory:  Immediate;   Good Recent;   Good Remote;   Good  Judgement:  Poor  Insight:  Lacking  Psychomotor Activity:  Normal  Concentration:  Fair  Recall:  Good  Fund of Knowledge:Good  Language: Good  Akathisia:  No  Handed:  Right  AIMS (if indicated):     Assets:  Financial Resources/Insurance Housing Social Support  ADL's:  Intact  Cognition: WNL  Sleep:  Number of Hours: 8     Current Medications: Current Facility-Administered Medications  Medication Dose Route Frequency Provider Last Rate Last Dose  . acetaminophen (TYLENOL) tablet 650 mg  650 mg Oral Q4H PRN Doctor Chlconversion, MD      . alum & mag hydroxide-simeth (MAALOX/MYLANTA) 200-200-20  MG/5ML suspension 30 mL  30 mL Oral Q4H PRN Doctor Chlconversion, MD   30 mL at 09/01/14 1631  . cloZAPine (CLOZARIL) tablet 200 mg  200 mg Oral Daily Jimmy FootmanAndrea Hernandez-Gonzalez, MD   200 mg at 09/06/14 0917  . cloZAPine (CLOZARIL) tablet 350 mg  350 mg Oral QHS Doctor Chlconversion, MD   350 mg at 09/05/14 2156  . docusate sodium (COLACE) capsule 200 mg  200 mg Oral BID Doctor Chlconversion, MD   200 mg at 09/06/14 0918  . fluticasone (FLOVENT HFA) 44 MCG/ACT inhaler 2 puff  2 puff Inhalation BID Doctor Chlconversion, MD   2 puff at 09/06/14 0914  . lithium carbonate (LITHOBID) CR tablet 300 mg  300 mg Oral Daily Doctor Chlconversion, MD   300 mg at 09/06/14 0918  . lithium carbonate (LITHOBID) CR tablet 600 mg  600 mg Oral QHS Doctor Chlconversion, MD   600 mg at 09/05/14 2156  . LORazepam (ATIVAN) tablet 2 mg  2 mg Oral Q6H PRN Jimmy FootmanAndrea Hernandez-Gonzalez, MD   2 mg at 09/05/14 2019  . metFORMIN (GLUCOPHAGE) tablet 500 mg  500 mg Oral BID WC Jimmy FootmanAndrea Hernandez-Gonzalez, MD   500 mg at 09/06/14 0917  . metoprolol succinate (TOPROL-XL) 24 hr tablet 50 mg  50 mg Oral Daily Jimmy FootmanAndrea Hernandez-Gonzalez, MD   50 mg at 09/06/14 0919  . nicotine (NICODERM CQ - dosed in mg/24 hours) patch 14 mg  14 mg Transdermal Daily Doctor Chlconversion, MD   14 mg at 09/06/14 0915  . nicotine (NICOTROL) 10 MG inhaler 1 continuous puffing  1 continuous puffing Inhalation PRN Doctor Chlconversion, MD   1 continuous puffing at 09/02/14 0950  . senna (SENOKOT) tablet 17.2 mg  2 tablet Oral QHS Doctor Chlconversion, MD   17.2 mg at 09/05/14 2157    Lab Results:  Results for orders placed or performed during the [facility] encounter of 08/22/14 (from the past 48 hour(s))  Lithium level     Status: None   Collection Time: 09/06/14  7:30 AM  Result Value Ref Range   Lithium Lvl 0.60 0.60 - 1.20 mmol/L    Physical Findings: AIMS:  , ,  ,  ,    CIWA:    COWS:     Treatment Plan Summary: Daily contact with patient to assess  and evaluate symptoms and progress in treatment and Medication management Schizoaffective disorder: -Continue Clozaril 200 mg by mouth every morning and 350 mg by mouth daily at bedtime. Dose will not be increased further until we obtain a Clozaril level.  We will check Clozaril level on Sunday.  -Continue lithium 300 mg by mouth every morning and 600 mg by mouth daily at bedtime for mood stabilization. Level 5/7: 0.6.    Agitation patient will receive Ativan 2 mg every 6 hours when necessary (started on 5/2 as pt agitated not responding to redirection)  Sedation and daytime drowsiness: I will discontinue ritalin.  As per family this medication has  worsening psychosis in the past.   Hypertension and tachycardia continue metoprolol 50 m.g  /day.  Vital signs are stable.  Tobacco use disorder continue Nicotrol inhaler in NicoDerm patch 14 mg daily  Constipation continue Colace 200 mg by mouth twice a day and Senokot 2 tablets at bedtime  COPD continue Flovent 2 pops daily  Hepatitis C at discharge will refer patient to primary care at [facility]  Possible discharge back to group home on Monday   Medical Decision Making:  Established Problem, Stable/Improving (1)      Jimmy Footman 09/06/2014, 11:50 AM

## 2014-09-07 NOTE — Progress Notes (Signed)
Patient's behavior has been calm and cooperative today. He has not been needy or intrusive or voicing any threats. States he is planning to be discharged tomorrow. He was irritable when getting evening dose of metformin, stating that, "we gave him diabetes." Acknowledged to patient that these meds do cause increase in blood sugar but not necessarily diabetes. Patient's affect is blunted. He denies SI/HI/AVH.

## 2014-09-07 NOTE — [facility] Group Notes (Signed)
[facility] LCSW Group Therapy  09/07/2014 2:44 PM  Type of Therapy:  Psychoeducational Skills  Participation Level:  None  Participation Quality:  n/a  Affect:  n/a  Cognitive:  n/a  Insight:  n/a  Engagement in Therapy:  n/a  Modes of Intervention:  n/a  Summary of Progress/Problems: Pt arrived for the last 10 minutes of group and was attentive but did not participate in group discussion.  Beryl MeagerIngle, Kioni Stahl T 09/07/2014, 2:44 PM

## 2014-09-07 NOTE — Plan of Care (Signed)
Problem: Ineffective individual coping Goal: LTG: Patient will report a decrease in negative feelings Outcome: Progressing No longer perseverating on DM Goal: STG: Patient will remain free from self harm Outcome: Progressing No self harm

## 2014-09-07 NOTE — Progress Notes (Signed)
Patient is alert and oriented x 4. Visible within the community but no noted interaction with peers. Patient reports he is feeling better and there was no overt s/s psychosis this shift. Patient is anxious to be discharged tomorrow as planned. Will continue to monitor mental status and response to treatment. Support through discharge transition.

## 2014-09-07 NOTE — Progress Notes (Signed)
[facility]  09/07/2014 9:45 AM Roy NeerJoel O Ewing  MRN:  [id number] Subjective:  Patient was seen today he was calmer and less hostile. He was pleasant and cooperative during assessment.  He reported feeling well this morning energetic and is states he was alert and awake this morning for breakfast even before the nurses call him up.   He denied having suicidality homicidality or auditory or visual hallucinations. He denied having any physical complaints. He denied having any side effects from his medications. He denied having problems with appetite energy or concentration.  I spoke with patient's mother today Gerarda GuntherDeborah Buelow. She reported that in the past the patient have worsened with ritalin.  Mother reports that they are very involved in patient's life they visit him every weekend at the group home and they take him out for outings.  He visits them at the home on the weekends but is not allowed to stay overnight as a few years ago the patient was acting bizarre (is standing at the neighbors porch) and  the neighbors had file some charges against him and took him to court.  Mother was okay with discharge plans. She plans to come and pick him up later in the afternoon and take him to the group home.  Principal Problem: Schizoaffective disorder, bipolar type Diagnosis:   Patient Active Problem List   Diagnosis Date Noted  . Schizoaffective disorder, bipolar type [F25.0] 08/31/2014  . Obesity [E66.9] 08/31/2014  . HTN (hypertension) [I10] 08/31/2014  . COPD (chronic obstructive pulmonary disease) [J44.9] 08/31/2014  . Sinus tachycardia [I47.1] 08/31/2014  . Constipation [K59.00] 08/31/2014  . Hepatitis C [B19.20] 08/31/2014   Total Time spent with patient: 30 minutes   Past Medical History: No past medical history on file.  Past Surgical History  Procedure Laterality Date  . Right knee -patela operation     Family History: No family history on file. Social History:  History   Alcohol Use No     History  Drug Use  . Yes  . Special: Marijuana    Comment: in remission    History   Social History  . Marital Status: Single    Spouse Name: N/A  . Number of Children: N/A  . Years of Education: N/A   Social History Main Topics  . Smoking status: Current Every Day Smoker  . Smokeless tobacco: Not on file  . Alcohol Use: No  . Drug Use: Yes    Special: Marijuana     Comment: in remission  . Sexual Activity: Not on file   Other Topics Concern  . Not on file   Social History Narrative  . No narrative on file   Additional History:    Sleep: Good  Appetite:  Good   Assessment:   Musculoskeletal: Strength & Muscle Tone: within normal limits Gait & Station: normal Patient leans: N/A   Psychiatric Specialty Exam: Physical Exam   Review of Systems  Respiratory: Negative for cough.   Cardiovascular: Negative for chest pain.  Gastrointestinal: Negative for nausea, vomiting, abdominal pain, diarrhea and constipation.  Musculoskeletal: Positive for joint pain.  Neurological: Negative for headaches.  Psychiatric/Behavioral: Negative for depression, suicidal ideas, hallucinations and substance abuse. The patient is not nervous/anxious and does not have insomnia.     Blood pressure 101/68, pulse 89, temperature 98.1 F (36.7 C), temperature source Oral, resp. rate 20, height 5' 7.99" (1.727 m), weight 91.2 kg (201 lb 1 oz).Body mass index is 30.58 kg/(m^2).  General Appearance:  Disheveled  Eye Contact::  Good  Speech:  Clear and Coherent  Volume:  Normal  Mood:  Irritable  Affect:  Constricted  Thought Process:  Tangential  Orientation:  Full (Time, Place, and Person)  Thought Content:  Delusions and Hallucinations: Auditory  Suicidal Thoughts:  No  Homicidal Thoughts:  No  Memory:  Immediate;   Good Recent;   Good Remote;   Good  Judgement:  Poor  Insight:  Lacking  Psychomotor Activity:  Normal  Concentration:  Fair  Recall:  Good   Fund of Knowledge:Good  Language: Good  Akathisia:  No  Handed:  Right  AIMS (if indicated):     Assets:  Financial Resources/Insurance Housing Social Support  ADL's:  Intact  Cognition: WNL  Sleep:  Number of Hours: 7.5     Current Medications: Current Facility-Administered Medications  Medication Dose Route Frequency Provider Last Rate Last Dose  . acetaminophen (TYLENOL) tablet 650 mg  650 mg Oral Q4H PRN Doctor Chlconversion, MD      . alum & mag hydroxide-simeth (MAALOX/MYLANTA) 200-200-20 MG/5ML suspension 30 mL  30 mL Oral Q4H PRN Doctor Chlconversion, MD   30 mL at 09/01/14 1631  . cloZAPine (CLOZARIL) tablet 200 mg  200 mg Oral Daily Jimmy FootmanAndrea Hernandez-Gonzalez, MD   200 mg at 09/07/14 0911  . cloZAPine (CLOZARIL) tablet 350 mg  350 mg Oral QHS Doctor Chlconversion, MD   350 mg at 09/06/14 2113  . docusate sodium (COLACE) capsule 200 mg  200 mg Oral BID Doctor Chlconversion, MD   200 mg at 09/07/14 0909  . fluticasone (FLOVENT HFA) 44 MCG/ACT inhaler 2 puff  2 puff Inhalation BID Doctor Chlconversion, MD   2 puff at 09/07/14 0810  . lithium carbonate (LITHOBID) CR tablet 300 mg  300 mg Oral Daily Doctor Chlconversion, MD   300 mg at 09/07/14 0908  . lithium carbonate (LITHOBID) CR tablet 600 mg  600 mg Oral QHS Doctor Chlconversion, MD   600 mg at 09/06/14 2113  . LORazepam (ATIVAN) tablet 2 mg  2 mg Oral Q6H PRN Jimmy FootmanAndrea Hernandez-Gonzalez, MD   2 mg at 09/05/14 2019  . metFORMIN (GLUCOPHAGE) tablet 500 mg  500 mg Oral BID WC Jimmy FootmanAndrea Hernandez-Gonzalez, MD   500 mg at 09/07/14 0909  . metoprolol succinate (TOPROL-XL) 24 hr tablet 50 mg  50 mg Oral Daily Jimmy FootmanAndrea Hernandez-Gonzalez, MD   50 mg at 09/07/14 0911  . nicotine (NICODERM CQ - dosed in mg/24 hours) patch 14 mg  14 mg Transdermal Daily Doctor Chlconversion, MD   14 mg at 09/07/14 0912  . nicotine (NICOTROL) 10 MG inhaler 1 continuous puffing  1 continuous puffing Inhalation PRN Doctor Chlconversion, MD   1 continuous puffing  at 09/02/14 0950  . senna (SENOKOT) tablet 17.2 mg  2 tablet Oral QHS Doctor Chlconversion, MD   17.2 mg at 09/06/14 2113    Lab Results:  Results for orders placed or performed during the hospital encounter of 08/22/14 (from the past 48 hour(s))  Lithium level     Status: None   Collection Time: 09/06/14  7:30 AM  Result Value Ref Range   Lithium Lvl 0.60 0.60 - 1.20 mmol/L    Physical Findings: AIMS:  , ,  ,  ,    CIWA:    COWS:     Treatment Plan Summary: Daily contact with patient to assess and evaluate symptoms and progress in treatment and Medication management Schizoaffective disorder: -Continue Clozaril 200 mg by mouth every morning  and 350 mg by mouth daily at bedtime. Dose will not be increased further until we obtain a Clozaril level.  Clozaril level was obtained today and is now pending  -Continue lithium 300 mg by mouth every morning and 600 mg by mouth daily at bedtime for mood stabilization. Level 5/7: 0.6.    Agitation patient will receive Ativan 2 mg every 6 hours when necessary (started on 5/2 as pt agitated not responding to redirection)  Sedation and daytime drowsiness: I will discontinue ritalin.  As per family this medication has worsening psychosis in the past.   Hypertension and tachycardia continue metoprolol 50 m.g  /day.  Vital signs are stable.  Tobacco use disorder continue Nicotrol inhaler in NicoDerm patch 14 mg daily  Constipation continue Colace 200 mg by mouth twice a day and Senokot 2 tablets at bedtime  COPD continue Flovent 2 pops daily  Hepatitis C at discharge will refer patient to primary care at [facility]  family practice  Possible discharge back to group home on Monday   Medical Decision Making:  Established Problem, Stable/Improving (1)      Jimmy Footman 09/07/2014, 9:45 AM

## 2014-09-07 NOTE — Progress Notes (Signed)
   09/07/14 1300  Clinical Encounter Type  Visited With Patient not available;Health care provider  Visit Type Initial  Referral From Nurse  Stress Factors  Patient Stress Factors Major life changes  Spoke to nurse.  Nurse said order may have been placed by mistake.  Pt's family expressed anxiety over pt being discharged soon.  Nurse spoke to patient.  Patient declined to speak to a chaplain.  [employer]bury Automotive GroupChaplain Dagmar Adcox-pager [phone]

## 2014-09-07 NOTE — [facility] Group Notes (Signed)
[facility] Group Notes:  (Nursing/MHT/Case Management/Adjunct)  Date:  09/07/2014  Time:  10:30 PM  Type of Therapy:  Group Therapy  Participation Level:  Active  Participation Quality:  Appropriate, Attentive and Sharing  Affect:  Appropriate  Cognitive:  Appropriate  Insight:  Appropriate  Engagement in Group:  Limited  Modes of Intervention:  Support  Summary of Progress/Problems:  Roy ReddenOlivia Briana [facility] 09/07/2014, 10:30 PM

## 2014-09-08 LAB — MISC LABCORP TEST (SEND OUT): Labcorp test code: 706440

## 2014-09-08 MED ORDER — METFORMIN HCL 500 MG PO TABS: 500.0000 mg | ORAL_TABLET | Freq: Two times a day (BID) | ORAL | Status: AC

## 2014-09-08 MED ORDER — CLOZAPINE 200 MG PO TABS: 200.0000 mg | ORAL_TABLET | Freq: Every day | ORAL | Status: AC

## 2014-09-08 MED ORDER — DOCUSATE SODIUM 100 MG PO CAPS: 200.0000 mg | ORAL_CAPSULE | Freq: Two times a day (BID) | ORAL | Status: DC

## 2014-09-08 MED ORDER — METOPROLOL SUCCINATE ER 50 MG PO TB24
50.0000 mg | ORAL_TABLET | Freq: Every day | ORAL | Status: AC
Start: 2014-09-08 — End: ?

## 2014-09-08 MED ORDER — NICOTINE 10 MG IN INHA: 1.0000 | RESPIRATORY_TRACT | Status: DC | PRN

## 2014-09-08 MED ORDER — SENNA 8.6 MG PO TABS: 2.0000 | ORAL_TABLET | Freq: Every day | ORAL | Status: DC

## 2014-09-08 MED ORDER — CLOZAPINE 100 MG PO TABS: 350.0000 mg | ORAL_TABLET | Freq: Every day | ORAL | Status: AC

## 2014-09-08 NOTE — Discharge Summary (Addendum)
Physician Discharge Summary Note  Patient:  [name] is an 34 y.o., male MRN:  [id number] DOB:  January 21, 1981 Patient phone:  [phone] (home)  Patient address:   [place] [address],  Total Time spent with patient: 30 minutes  Date of Admission:  08/22/2014 Date of Discharge: 09/08/2014  Reason for Admission:  Hallucinations and delusional thinking long with agitation  Principal Problem: Schizoaffective disorder, bipolar type Discharge Diagnoses: Patient Active Problem List   Diagnosis Date Noted  . Schizoaffective disorder, bipolar type [F25.0] 08/31/2014  . Obesity [E66.9] 08/31/2014  . HTN (hypertension) [I10] 08/31/2014  . COPD (chronic obstructive pulmonary disease) [J44.9] 08/31/2014  . Sinus tachycardia [I47.1] 08/31/2014  . Constipation [K59.00] 08/31/2014  . Hepatitis C [B19.20] 08/31/2014    Musculoskeletal: Strength & Muscle Tone: within normal limits Gait & Station: normal Patient leans: N/A  Psychiatric Specialty Exam: Physical Exam  Review of Systems  Respiratory: Negative for cough.   Cardiovascular: Negative for chest pain.  Gastrointestinal: Negative for nausea, vomiting, abdominal pain, diarrhea and constipation.  Musculoskeletal: Positive for joint pain.  Neurological: Negative for dizziness and headaches.  Psychiatric/Behavioral: Negative for depression, suicidal ideas, hallucinations and substance abuse. The patient is not nervous/anxious and does not have insomnia.     Blood pressure 110/75, pulse 88, temperature 98.3 F (36.8 C), temperature source Oral, resp. rate 20, height 5' 7.99" (1.727 m), weight 91.2 kg (201 lb 1 oz).Body mass index is 30.58 kg/(m^2).  General Appearance: Fairly Groomed  Engineer, water::  Good  Speech:  Normal Rate  Volume:  Normal  Mood:  Euthymic  Affect:  Constricted  Thought Process:  concrete  Orientation:  Full (Time, Place, and Person)  Thought Content:   Hallucinations: None  Suicidal Thoughts:  No  Homicidal Thoughts:  No  Memory:  Immediate;   Good Recent;   Good Remote;   Good  Judgement:  Poor  Insight:  Lacking  Psychomotor Activity:  Normal  Concentration:  Good  Recall:  NA  Fund of Knowledge:Fair  Language: Good  Akathisia:  No  Handed:    AIMS (if indicated):     Assets:  Financial Resources/Insurance Housing Social Support  ADL's:  Intact  Cognition: WNL  Sleep:  Number of Hours: 7.3      Has this patient used any form of tobacco in the last 30 days? (Cigarettes, Smokeless Tobacco, Cigars, and/or Pipes) Yes, A prescription for an FDA-approved tobacco cessation medication was offered at discharge and the patient refused  Past Medical History: No past medical history on file.  Past Surgical History  Procedure Laterality Date  . Right knee -patela operation     Family History: No family history on file. Social History:  History  Alcohol Use No     History  Drug Use  . Yes  . Special: Marijuana    Comment: in remission    History   Social History  . Marital Status: Single    Spouse Name: N/A  . Number of Children: N/A  . Years of Education: N/A   Social History Main Topics  . Smoking status: Current Every Day Smoker  . Smokeless tobacco: Not on file  . Alcohol Use: No  . Drug Use: Yes    Special: Marijuana     Comment: in remission  . Sexual Activity: Not on file   Other Topics Concern  . Not on file   Social History Narrative  . No narrative on file  Level of Care:  OP    DATE OF ADMISSION: 08/22/2014  CHIEF COMPLAINT: "Mart Piggs has been 'jumping out of my window.'"   HPI: The patient indicates there is a person, Mart Piggs, who through review of the paperwork was perhaps a former high school classmate. He states that he believes this individual is doing things to him, such as shooting him up with some type of drug or chemical. He states this happened earlier this week. He states  that his group home manager heard him yelling loudly and then eventually told him to get ready to come to the hospital. He states that no medications have worked for him to control these voices or his beliefs. He states the only thing that controls it is when he decides to do it. He states he has been taking his medications as prescribed. He denies being depressed. He denies any suicidal ideation. Denies any homicidal ideation.   He was noted in his group home to be doing punching and kickboxing moves in the hallway. He states that this is the only way to take care of Mart Piggs.   PAST PSYCHIATRIC HISTORY: The patient states he has had 2-3 hospitalizations. The last one was 2-3 months ago at this facility. He does report prior trials of Zyprexa, Depakote and Risperdal. In regard to substances, he states he used marijuana 10 years ago. He states that he did use alcohol heavily for 1 to 2 years when he was in his early 53s. He states his use then would be 8-12 beers every few days. He denies any past suicide attempts.   PAST MEDICAL HISTORY: He states he had a right knee patella surgery 4-6 years ago.   FAMILY HISTORY OF MENTAL ILLNESS: He states he does not know of any a diagnosed mental illness in his family.   SOCIAL HISTORY: The patient has been in a group home for the past 2-1/2 years. He is noted to get along reasonably well with others other than during periods of psychosis. He has some contact with his family.   CURRENT MEDICATIONS: Cogentin 0.5 mg twice daily, Tranxene 7.5 mg 3 times a day, Clozapine 250 mg at night, Flexeril 10 mg twice a day, Flonase inhaler 2 puffs q. 12 h with spacer, haloperidol 5 mg twice a day as needed for agitation, lithium carbonate 600 mg at night and 300 milligrams in the morning, propranolol 10 mg twice a day, tramadol 50 mg twice a day, as needed for pain, Abilify Maintena 400 mg.   ALLERGIES: No known drug allergies.   MENTAL STATUS EXAMINATION at  admission: Poorly groomed, young male, appearing his stated age of 19. He was alert and oriented x3. His speech was normal rate and volume. He was cooperative with the interview. His thought process was disorganized. His thought content was significant for delusions. There were no auditory or visual hallucinations visible during this interview; however, per history, he does have visual and tactile hallucinations. During the interview, he did touch his arm and say it was shaking and stated that it was Mart Piggs making that happened. He denies any suicidal ideation. Denies any homicidal ideation. He denies any paranoia outside of the delusion about Mart Piggs. His remote memory is intact as evident by his ability to describe past history and treatment. His immediate memory is intact as evident by his ability to discuss recent events in terms of getting him into the hospital.   LABORATORY RESULTS at admission: His comprehensive metabolic panel  is within normal limits except for a mildly elevated BUN at 22 and glucose elevated at 110. He has elevated ALT at 152 and AST at 67. His urine tox screen is positive for benzodiazepines. Lithium level is 0.99. Ethanol level less than 5. CBC is white count slightly elevated at 15.7. His urinalysis is within normal limits. His acetaminophen and salicylate levels were within normal limits. His clozapine level is 364. His clozapine and norclozapine level, combined, is 450, and his norclozapine level was 86.   Hospital Course:  Due to worsening psychosis, and severe delusional thinking Clozaril dose was titrated up to a total of 550 mg daily. Despite this dose patient still continued to report hallucinations, voices telling him that medications with food on his drinks or injected at night while he was in his room. However this complaints were reported less often by the patient and they appeared to be less distressing for him.  During the early part of the hospitalization the  patient was seen fighting in the air, kicking and punching, this behavior was not seen several days prior to discharge.  Patient attended programming. Did not have any altercations with peers. There was no need for seclusion restraint or forced medications.  Patient had about 3 episodes where he became verbally aggressive towards M.D. this was mainly due to patient requesting discharge.  As he does not have good insight and understanding of his illness and symptoms patient was not able to understand the reasoning for not discharging him when he requested it.    Psychosis patient will be discharged on Clozaril 200 mg by mouth every morning and 350 mg by mouth daily at bedtime  For mood stabilization we will be continued on lithium carbonate 300 mg by mouth every morning and 600 mg by mouth daily at bedtime  For metabolic syndrome and prevention he was restarted on metformin 500 mg by mouth twice a day  For hypertension and tachycardia and he was started on metoprolol XL  For Clozaril induced constipation he will be discharged on Colace 200 mg by mouth twice a day and 2 tabs of senna by mouth daily at bedtime  For COPD the patient was continue Flovent  For tobacco use disorder he received Nicotrol inhaler when necessary  Hepatitis C: Elevated liver enzymes led to M.D. order hepatitis panel. He was found to be positive for hepatitis C. Liver ultrasound was within normal limits.  Patient's mother was informed about the results. At discharge we attempted to make an appointment with primary care provider, Dr. Alice Reichert, but we were unable to connect with the clinic.  We contacted the patient's mother and ask her to make a follow-up for him at discharge.  Discharge: The patient will return to group home, he will return to [facility] and will follow-up with Dr. Collie Siad his outpatient psychiatrist.   Consults:  None   Discharge Vitals:   Blood pressure 110/75, pulse 88, temperature 98.3 F (36.8 C), temperature  source Oral, resp. rate 20, height 5' 7.99" (1.727 m), weight 91.2 kg (201 lb 1 oz). Body mass index is 30.58 kg/(m^2).  Lab Results:   Results for JABEN, BENEGAS (MRN [id number]) as of 09/08/2014 14:43  Ref. Range 08/20/2014 08:48 08/20/2014 10:00 08/24/2014 07:35 08/25/2014 09:20 08/26/2014 06:50 09/02/2014 06:53 09/03/2014 06:45 09/06/2014 07:30  Sodium Latest Units: mmol/L 141         Potassium Latest Units: mmol/L 4.5         Chloride Latest Units: mmol/L 107  CO2 Latest Units: mmol/L 30         BUN Latest Units: mg/dL 22 (H)         Creatinine Latest Units: mg/dL 0.95         Calcium Latest Units: mg/dL 9.4         EGFR (Non-African Amer.) Unknown >60         EGFR (African American) Unknown >60         Glucose Latest Units: mg/dL 110 (H)         Anion gap Latest Ref Range: 7-16  4 (L)         Alkaline Phosphatase Latest Units: U/L 56  56       Albumin Latest Units: g/dL 4.6  4.4       AST Latest Units: U/L 67 (H)  77 (H)       ALT Latest Units: U/L 152 (H)  165 (H)       Total Protein Latest Units: g/dL 7.3  7.4       Bilirubin, Direct Latest Units: mg/dL   <0.1       Indirect Bilirubin Unknown   SEE COMMENT       Total Bilirubin Latest Units: mg/dL 0.4  0.7       WBC Latest Ref Range: 3.8-10.6 K/uL 15.7 (H)  14.6 (H)  17.7 (H) 14.6 (H)    RBC Latest Ref Range: 4.40-5.90 MIL/uL 4.79  4.82   4.66    Hemoglobin Latest Ref Range: 13.0-18.0 g/dL      14.3    HGB Latest Ref Range: 13.0-18.0 g/dL 14.6  14.5       HCT Latest Ref Range: 40.0-52.0 %      43.0    HCT Latest Ref Range: 40.0-52.0 % 44.5  44.7       MCV Latest Ref Range: 80.0-100.0 fL 93  93   92.3    MCH Latest Ref Range: 26.0-34.0 pg 30.5  30.2   30.7    MCHC Latest Ref Range: 32.0-36.0 g/dL 32.8  32.5   33.2    RDW Latest Ref Range: 11.5-14.5 % 14.3  14.5   14.2    Platelets Latest Ref Range: 150-440 K/uL 251  264   264    Neutrophils Latest Units: % 72.5  69.2  72.2 68    Lymphocytes Latest Units: % 16.1  19.0  15.0 20     Monocytes Relative Latest Units: % 6.2  6.9  8.4 8    Eosinophil Latest Units: % 4.5  4.2  3.5 3    Basophil Latest Units: % 0.7  0.7  0.9 1    NEUT# Latest Ref Range: 1.4-6.5 K/uL 11.4 (H)  10.1 (H)  12.8 (H) 10.0 (H)    Lymphocyte # Latest Ref Range: 1.0-3.6 K/uL 2.5  2.8  2.7 2.9    Monocyte # Latest Ref Range: 0.2-1.0 K/uL 1.0  1.0  1.5 (H) 1.2 (H)    Eosinophils Absolute Latest Ref Range: 0-0.7 K/uL 0.7  0.6  0.6 0.4    Basophils Absolute Latest Ref Range: 0-0.1 K/uL 0.1  0.1  0.2 (H) 0.1    Lithium Lvl Latest Ref Range: 0.60-1.20 mmol/L       0.58 (L) 0.60  Lithium Latest Ref Range: 0.60-1.20 mmol/L 0.99    1.96 (L)     Salicylates, Serum Latest Units: mg/dL <4         Acetaminophen Latest Units: ug/mL <10  Bacteria Latest Ref Range: NONE SEEN   NONE SEEN        Bilirubin,UR Latest Ref Range: NEGATIVE   Negative        Blood Latest Ref Range: NEGATIVE   Negative        Clarity - urine Unknown  Clear        Color - urine Unknown  Yellow        Glucose,UR Latest Ref Range: 0-75 mg/dL  Negative        Ketone Latest Ref Range: NEGATIVE   Negative        Leukocyte Esterase Latest Ref Range: NEGATIVE   Negative        Mucous Unknown  PRESENT        Nitrite Latest Ref Range: NEGATIVE   Negative        pH Latest Ref Range: 4.5-8.0   6.0        Protein Latest Ref Range: NEGATIVE   Negative        RBC,UR Latest Ref Range: 0-5 /HPF  0-5        SPECIFIC GRAVITY Latest Ref Range: 1.003-1.030   1.012        Squamous Epithelial Unknown  NONE SEEN        WBC UR Latest Ref Range: 0-5 /HPF  0-5        Potassium, Urine Random Latest Units: mmol/L DNP         Sodium, Urine Random Latest Units: mmol/L DNP         Chloride, Urine Random Latest Units: mmol/L DNP         Glucose, CSF Latest Units: mg/dL DNP         Ethanol Latest Units: mg/dL <5         Amphetamines, Ur Screen Unknown  NEGATIVE        Barbiturates, Ur Screen Unknown  NEGATIVE        Benzodiazepine, Ur Scrn Unknown  POSITIVE         Cocaine Metabolite,Ur [place] Unknown  NEGATIVE        MDMA (Ecstasy)Ur Screen Unknown  NEGATIVE        Methadone, Ur Screen Unknown  NEGATIVE        Cannabinoid 50 Ng, Ur [place] Unknown  NEGATIVE        Opiate, Ur Screen Unknown  NEGATIVE        Phencyclidine (PCP) Ur S Unknown  NEGATIVE        Tricyclic, Ur Screen Unknown  POSITIVE         [facility] test name clazapine   Misc [facility] result COMMENT   Comments: (NOTE)  Test Ordered: [id number] Clozapine (Clozaril), Serum  Clozapine, Serum        525       ng/mL  BN    Reference Range: 350-650                       **Please note reference interval change**  Norclozapine, Serum      284       ng/mL  BN    Reference Range: Not Estab.               Total(Cloz+Norcloz)      809       ng/mL  BN            Significant Diagnostic Studies: US abdomen   CLINICAL DATA: Elevated LFTs  EXAM: US ABDOMEN LIMITED - RIGHT  UPPER QUADRANT  COMPARISON: None.  FINDINGS: Gallbladder:  The gallbladder is adequately distended with no evidence of stones, wall thickening, or pericholecystic fluid. There is no positive sonographic Murphy's sign.  Common bile duct:  Diameter: 2.4 mm  Liver:  The liver exhibits normal echotexture with no focal mass or ductal dilation. The surface contour is normal.  IMPRESSION: Normal limited right upper quadrant ultrasound examination.  See Psychiatric Specialty Exam and Suicide Risk Assessment completed by Attending Physician prior to discharge.  Discharge destination:  Other:  Group home  Is patient on multiple antipsychotic therapies at discharge:  No   Has Patient had three or more failed trials of antipsychotic monotherapy by history:  No    Recommended Plan for Multiple Antipsychotic Therapies: NA  Discharge Instructions    Diet - low sodium heart healthy    Complete by:  As directed              Medication List    STOP taking these medications        ARIPiprazole 400 MG Susr     benztropine 0.5 MG tablet  Commonly known as:  COGENTIN     clorazepate 15 MG tablet  Commonly known as:  TRANXENE     cyclobenzaprine 10 MG tablet  Commonly known as:  FLEXERIL     haloperidol 5 MG tablet  Commonly known as:  HALDOL     naproxen 500 MG tablet  Commonly known as:  NAPROSYN     propranolol 10 MG tablet  Commonly known as:  INDERAL     traMADol 50 MG tablet  Commonly known as:  ULTRAM      TAKE these medications      Indication   clozapine 200 MG tablet  Commonly known as:  CLOZARIL  Take 1 tablet (200 mg total) by mouth daily.  Notes to Patient:  Psychosis       cloZAPine 100 MG tablet  Commonly known as:  CLOZARIL  Take 3.5 tablets (350 mg total) by mouth at bedtime.  Notes to Patient:  psychosis      docusate sodium 100 MG capsule  Commonly known as:  COLACE  Take 2 capsules (200 mg total) by mouth 2 (two) times daily.  Notes to Patient:  constipation      fluticasone 44 MCG/ACT inhaler  Commonly known as:  FLOVENT HFA  Inhale 2 puffs into the lungs 2 (two) times daily.  Notes to Patient:  COPD      lithium carbonate 300 MG capsule  Take 300-600 mg by mouth 2 (two) times daily. 1 capsule every morning and 2 capsules daily at bedtime  Notes to Patient:  Mood      metFORMIN 500 MG tablet  Commonly known as:  GLUCOPHAGE  Take 1 tablet (500 mg total) by mouth 2 (two) times daily with a meal.  Notes to Patient:  Prevent diabetes      metoprolol succinate 50 MG 24 hr tablet  Commonly known as:  TOPROL-XL  Take 1 tablet (50 mg total) by mouth daily. Take with or immediately following a meal.  Notes to Patient:  HTN and tachycardia      nicotine 10 MG inhaler  Commonly known as:  NICOTROL  Inhale 1 cartridge (1 continuous puffing total) into the lungs as needed for smoking cessation (Q1h PRN for nicotine craving. Puff on cartridge for about 20 minutes.  Limit 6-16 per 24hr).  Notes to Patient:  Nicotine craving  senna 8.6 MG Tabs tablet  Commonly known as:  SENOKOT  Take 2 tablets (17.2 mg total) by mouth at bedtime.  Notes to Patient:  constipation            Follow-up Information    Follow up with [facility]. Call in 1 week.   Why:  Hospital Discharge Follow up, patients provider will contact [facility] Dr Evelena Peat for discharge follow up as pt resides in group home.   Contact information:   [place] Phone# [phone] Fax [phone]      Follow-up recommendations:  Other:  Continue PSR and follow-up with outpatient psychiatrist  Comments:  Continue ANC every week clinic. Next ANC due on May 10  Total Discharge Time: Longer than 30 minutes. More than 50% of the time was spent in coordination of care. We attempted to make an appointment with family practice for hepatitis C follow-up. We contacted the patient's mother and review panel care .  Signed: Hildred Priest 09/08/2014, 2:39 PM

## 2014-09-08 NOTE — Progress Notes (Signed)
  [facility] :  Will you be returning to the same living situation after discharge:  Yes,  [facility] At discharge, do you have transportation home?: Yes,  [facility] Do you have the ability to pay for your medications: Yes,  [place] [employer]  Release of information consent forms completed and in the chart;  Patient's signature needed at discharge.  Patient to Follow up at: Follow-up Information    Follow up with [facility]. Call in 1 week.   Why:  Hospital Discharge Follow up, patients provider will contact [facility] Dr Fleeta EmmerSui for discharge follow up as pt resides in group home.   Contact information:   [address] [place] Phone# [phone] Fax [phone]      Patient denies SI/HI: Yes,  Patient is doing well, no SI or HI or [facility]    Safety Planning and Suicide Prevention discussed: Yes,  Patient was provided a suicide prevention handout on May 7th,16     Has patient been referred to the Quitline?: Patient refused referral  Arrie SenateBandi, Quang Thorpe M 09/08/2014, 8:41 AM

## 2014-09-08 NOTE — [facility] Group Notes (Signed)
[facility] Discharge Planning [facility] Note  09/08/2014 11:42 AM  Participation Quality:  Attentive  Affect:  Flat  Cognitive:  Alert  Insight:  Lacking  Engagement in Group:  Lacking  Modes of Intervention:  Socialization  Summary of Progress/Problems: Pt was alert during group but did not share during group discussion unless asked questions. Pt received Monday workbook on "Wellness" and shared that his SMART goal is to "get out today, eat a nice meal, and smoke a cigarette".   Beryl Meagerngle, Pavel Gadd T 09/08/2014, 11:42 AM

## 2014-09-08 NOTE — Plan of Care (Signed)
Problem: [facility] Participation in Recreation Therapeutic Interventions Goal: STG-Patient will demonstrate improved self esteem by identif STG: Self-Esteem - Within 8 treatment sessions, patient will verbalize at least 5 positive affirmation statements in each of 5 treatment sessions to increase self-esteem post d/c.  Outcome: Adequate for Discharge Treatment Session 5; Completed 4 out of 5: At approximately 11:20 am, LRT met with patient in patient room. Patient reported he threw away his positive affirmation statements.  Leonette Monarch, LRT/CTRS 05.09.16 12:28 pm

## 2014-09-08 NOTE — Progress Notes (Signed)
Recreation Therapy Notes  INPATIENT RECREATION TR PLAN  Patient Details Name: [name] MRN: [id number] DOB: 12-26-1980 Today's Date: 09/08/2014  Rec Therapy Plan Is patient appropriate for Therapeutic Recreation?: Yes Treatment times per week: At least 3 times a week TR Treatment/Interventions: 1:1 session, Group participation (Comment) (Appropriate participation in daily recreation therapy tx)  Discharge Criteria Pt will be discharged from therapy if:: Discharged Treatment plan/goals/alternatives discussed and agreed upon by:: Patient/family  Discharge Summary Short term goals set: See Care Plan Short term goals met: Adequate for discharge Progress toward goals comments: Groups attended Which groups?: Communication, Social skills, Coping skills, Leisure education, Self-esteem One-to-one attended: Self-esteem Reason goals not met: Patient threw away the positive affirmation statements and planning to d/c Therapeutic equipment acquired: None Reason patient discharged from therapy: Discharge from hospital Pt/family agrees with progress & goals achieved: Yes Date patient discharged from therapy: 09/08/14   Leonette Monarch, LRT/CTRS 09/08/2014, 12:31 PM

## 2014-09-08 NOTE — [facility] Suicide Risk Assessment (Signed)
[facility] INPATIENT:  Family/Significant Other Suicide Prevention Education  Suicide Prevention Education: Patient was provided a suicide prevention handout May 7th/16. He is returning to a 24 hour care facility. Patient Discharged to Other Healthcare Facility:  Suicide Prevention Education Not Provided: {PT. DISCHARGED TO OTHER HEALTHCARE FACILITY: The patient is discharging to another healthcare facility for continuation of treatment.  The patient's medical information, including suicide ideations and risk factors, are a part of the medical information shared with the receiving healthcare facility.  [name], Devona Holmes M 09/08/2014, 8:38 AM

## 2014-09-08 NOTE — Progress Notes (Signed)
Patient discharged ambulatory to caretaker. Denies SI or HI. Discharge instructions reviewed with patient and caretaker, they verbalize understanding. Patient received copy of discharge instructions, prescriptions, and all personal belongings.

## 2014-09-08 NOTE — B[facility] Suicide Risk Assessment (Signed)
[facility]   Demographic Factors:  Male and Caucasian  Total Time spent with patient: 30 minutes  Musculoskeletal: Strength & Muscle Tone: within normal limits Gait & Station: normal Patient leans: N/A  Psychiatric Specialty Exam: Physical Exam  ROS  Blood pressure 110/75, pulse 88, temperature 98.3 F (36.8 C), temperature source Oral, resp. rate 20, height 5' 7.99" (1.727 m), weight 91.2 kg (201 lb 1 oz).Body mass index is 30.58 kg/(m^2).                                                          Has this patient used any form of tobacco in the last 30 days? (Cigarettes, Smokeless Tobacco, Cigars, and/or Pipes) Yes, A prescription for an FDA-approved tobacco cessation medication was offered at discharge and the patient refused  Mental Status Per Nursing Assessment::   On Admission:     Current Mental Status by Physician: denies SI, HI or hallucinations.  Calm and cooperative. Hopeful and future oriented  Loss Factors: none NA  Historical Factors: prior hospitalizations.  No prior suicidal attempts.  Risk Reduction Factors:   Sense of responsibility to family, Living with another person, especially a relative, Positive social support and Positive therapeutic relationship  Continued Clinical Symptoms:  Schizoaffective disorder Cognitive Features That Contribute To Risk:  None    Suicide Risk:  Minimal: No identifiable suicidal ideation.  Patients presenting with no risk factors but with morbid ruminations; may be classified as minimal risk based on the severity of the depressive symptoms  Principal Problem: Schizoaffective disorder, bipolar type Discharge Diagnoses:  Patient Active Problem List   Diagnosis Date Noted  . Schizoaffective disorder, bipolar type [F25.0] 08/31/2014  . Obesity [E66.9] 08/31/2014  . HTN (hypertension) [I10] 08/31/2014  . COPD (chronic obstructive pulmonary disease) [J44.9] 08/31/2014  .  Sinus tachycardia [I47.1] 08/31/2014  . Constipation [K59.00] 08/31/2014  . Hepatitis C [B19.20] 08/31/2014    Follow-up Information    Follow up with [facility]. Call in 1 week.   Why:  Hospital Discharge Follow up, patients provider will contact [facility] Dr Fleeta EmmerSui for discharge follow up as pt resides in group home.   Contact information:   [address] [place] Phone# [phone] Fax [phone]      Plan Of Care/Follow-up recommendations:  Other:  continue clozaril, continue PSR, continue to f/u with psychiatry.  [facility]  Is patient on multiple antipsychotic therapies at discharge:  No   Has Patient had three or more failed trials of antipsychotic monotherapy by history:  No  Recommended Plan for Multiple Antipsychotic Therapies: NA    Jimmy FootmanHernandez-Gonzalez,  Kirill Chatterjee 09/08/2014, 12:46 PM

## 2015-01-11 ENCOUNTER — Encounter: Payer: Self-pay | Admitting: Emergency Medicine

## 2015-01-11 ENCOUNTER — Emergency Department
Admission: EM | Admit: 2015-01-11 | Discharge: 2015-01-12 | Disposition: A | Discharge status: Other Acute Inpatient Hospital | Payer: Medicaid Other | Attending: Emergency Medicine | Admitting: Emergency Medicine

## 2015-01-11 DIAGNOSIS — I10 Essential (primary) hypertension: Secondary | ICD-10-CM | POA: Diagnosis present

## 2015-01-11 DIAGNOSIS — Z7951 Long term (current) use of inhaled steroids: Secondary | ICD-10-CM | POA: Insufficient documentation

## 2015-01-11 DIAGNOSIS — Z79899 Other long term (current) drug therapy: Secondary | ICD-10-CM | POA: Insufficient documentation

## 2015-01-11 DIAGNOSIS — R000 Tachycardia, unspecified: Secondary | ICD-10-CM | POA: Diagnosis present

## 2015-01-11 DIAGNOSIS — F250 Schizoaffective disorder, bipolar type: Secondary | ICD-10-CM | POA: Diagnosis not present

## 2015-01-11 DIAGNOSIS — F1310 Sedative, hypnotic or anxiolytic abuse, uncomplicated: Secondary | ICD-10-CM | POA: Insufficient documentation

## 2015-01-11 DIAGNOSIS — R4781 Slurred speech: Secondary | ICD-10-CM | POA: Diagnosis not present

## 2015-01-11 DIAGNOSIS — B1920 Unspecified viral hepatitis C without hepatic coma: Secondary | ICD-10-CM | POA: Diagnosis present

## 2015-01-11 DIAGNOSIS — Z720 Tobacco use: Secondary | ICD-10-CM | POA: Insufficient documentation

## 2015-01-11 DIAGNOSIS — R45851 Suicidal ideations: Secondary | ICD-10-CM

## 2015-01-11 DIAGNOSIS — R45850 Homicidal ideations: Secondary | ICD-10-CM

## 2015-01-11 DIAGNOSIS — F309 Manic episode, unspecified: Secondary | ICD-10-CM | POA: Insufficient documentation

## 2015-01-11 HISTORY — DX: Depression, unspecified: F32.A

## 2015-01-11 HISTORY — DX: Bipolar disorder, unspecified: F31.9

## 2015-01-11 HISTORY — DX: Schizoaffective disorder, bipolar type: F25.0

## 2015-01-11 HISTORY — DX: Schizoaffective disorder, unspecified: F25.9

## 2015-01-11 HISTORY — DX: Major depressive disorder, single episode, unspecified: F32.9

## 2015-01-11 LAB — COMPREHENSIVE METABOLIC PANEL
ALBUMIN: 4.5 g/dL (ref 3.5–5.0)
ALK PHOS: 65 U/L (ref 38–126)
ALT: 165 U/L — ABNORMAL HIGH (ref 17–63)
AST: 75 U/L — ABNORMAL HIGH (ref 15–41)
Anion gap: 9 (ref 5–15)
BUN: 21 mg/dL — ABNORMAL HIGH (ref 6–20)
CO2: 23 mmol/L (ref 22–32)
CREATININE: 0.83 mg/dL (ref 0.61–1.24)
Calcium: 9.4 mg/dL (ref 8.9–10.3)
Chloride: 107 mmol/L (ref 101–111)
GFR calc Af Amer: >60 mL/min (ref >60)
GFR calc non Af Amer: >60 mL/min (ref >60)
GLUCOSE: 101 mg/dL — AB (ref 65–99)
Potassium: 4.1 mmol/L (ref 3.5–5.1)
SODIUM: 139 mmol/L (ref 135–145)
Total Bilirubin: 0.3 mg/dL (ref 0.3–1.2)
Total Protein: 7.3 g/dL (ref 6.5–8.1)

## 2015-01-11 LAB — CBC
HEMATOCRIT: 45.7 % (ref 40.0–52.0)
HEMOGLOBIN: 15.6 g/dL (ref 13.0–18.0)
MCH: 30.9 pg (ref 26.0–34.0)
MCHC: 34 g/dL (ref 32.0–36.0)
MCV: 90.8 fL (ref 80.0–100.0)
Platelets: 250 10*3/uL (ref 150–440)
RBC: 5.04 MIL/uL (ref 4.40–5.90)
RDW: 14.6 % — ABNORMAL HIGH (ref 11.5–14.5)
WBC: 14.8 10*3/uL — ABNORMAL HIGH (ref 3.8–10.6)

## 2015-01-11 LAB — ACETAMINOPHEN LEVEL: Acetaminophen (Tylenol), Serum: <10 ug/mL — ABNORMAL LOW (ref 10–30)

## 2015-01-11 LAB — ETHANOL: Alcohol, Ethyl (B): <5 mg/dL (ref <5)

## 2015-01-11 LAB — URINE DRUG SCREEN, QUALITATIVE (ARMC ONLY)
Amphetamines, Ur Screen: NEGATIVE — AB
Barbiturates, Ur Screen: NEGATIVE — AB
Benzodiazepine, Ur Scrn: NEGATIVE — AB
CANNABINOID 50 NG, UR ~~LOC~~: NEGATIVE — AB
COCAINE METABOLITE, UR ~~LOC~~: NEGATIVE — AB
MDMA (ECSTASY) UR SCREEN: NEGATIVE — AB
Methadone Scn, Ur: NEGATIVE — AB
OPIATE, UR SCREEN: NEGATIVE — AB
Phencyclidine (PCP) Ur S: NEGATIVE — AB
Tricyclic, Ur Screen: POSITIVE — AB

## 2015-01-11 LAB — SALICYLATE LEVEL

## 2015-01-11 LAB — LITHIUM LEVEL: Lithium Lvl: <0.06 mmol/L — ABNORMAL LOW (ref 0.60–1.20)

## 2015-01-11 MED ORDER — DIPHENHYDRAMINE HCL 25 MG PO CAPS
50.0000 mg | ORAL_CAPSULE | Freq: Once | ORAL | Status: AC
  Administered 2015-01-12: 50 mg via ORAL
  Filled 2015-01-11: qty 2

## 2015-01-11 MED ORDER — NICOTINE 10 MG IN INHA
1.0000 | RESPIRATORY_TRACT | Status: DC | PRN
  Administered 2015-01-11 – 2015-01-12 (×6): 1 via RESPIRATORY_TRACT
  Filled 2015-01-11: qty 36

## 2015-01-11 MED ORDER — LORAZEPAM 1 MG PO TABS
1.0000 mg | ORAL_TABLET | Freq: Once | ORAL | Status: AC
  Administered 2015-01-11: 1 mg via ORAL
  Filled 2015-01-11: qty 1

## 2015-01-11 NOTE — BH Assessment (Signed)
Assessment Note  [name] is an 34 y.o. male. [name] reports to the ED from [facility].  He reports that he is not depressed or anxious.  He states that he is having auditory and visual hallucinations.  He reports that he is not suicidal or homicidal.  He reports that the staff at the group home make him go out every day even though he feels sick and wants to stay at the group home because he has to go to the bathroom "at least 12 times in the morning".  He states that the staff refuses to give him his medication and ball up his prescriptions and throw them away.  He reports that the people in his head don't want him to be placed inpatient, because "they do not want to be locked in here and have to see through my eyes". [facility] Staff Everlean Alstrom) reports to the TTS that [name] was with his mother today, when he expressed suicidal ideation.  He reportedly asked her for a gun to kill himself with.  Further, He reported to his mother that he is hearing voices and he wanted to be dead. States that he is embarrassed to go to the day program due to health issues.  Axis I: Schizoaffective Disorder Axis II: Deferred Axis III:  Past Medical History  Diagnosis Date  . Bipolar 1 disorder   . Schizo affective schizophrenia   . Depression    Axis IV: other psychosocial or environmental problems Axis V: 31-40 impairment in reality testing  Past Medical History:  Past Medical History  Diagnosis Date  . Bipolar 1 disorder   . Schizo affective schizophrenia   . Depression     Past Surgical History  Procedure Laterality Date  . Right knee -patela operation      Family History: No family history on file.  Social History:  reports that he has been smoking.  He does not have any smokeless tobacco history on file. He reports that he uses illicit drugs (Marijuana). He reports that he does not drink alcohol.  Additional Social History:  Alcohol / Drug Use History of  alcohol / drug use?: No history of alcohol / drug abuse  CIWA: CIWA-Ar BP: (!) 127/110 mmHg Pulse Rate: (!) 117 COWS:    Allergies: No Known Allergies  Home Medications:  (Not in a hospital admission)  OB/GYN Status:  No LMP for male patient.  General Assessment Data Location of Assessment: [facility] ED TTS Assessment: In system Is this a Tele or Face-to-Face Assessment?: Face-to-Face Is this an Initial Assessment or a Re-assessment for this encounter?: Initial Assessment Marital status: Single Maiden name: n/a Is patient pregnant?: No Pregnancy Status: No Living Arrangements: Group Home Can pt return to current living arrangement?: Yes Admission Status: Involuntary Is patient capable of signing voluntary admission?: Yes Referral Source: Self/Family/Friend Insurance type: Medicaid  Medical Screening Exam [facility] Walk-in ONLY) Medical Exam completed: Yes  Crisis Care Plan Living Arrangements: Group Home Name of Psychiatrist: Dr. Providence Crosby Name of Therapist: Dr. Providence Crosby  Education Status Is patient currently in school?: No Current Grade: n/a Highest grade of school patient has completed: 12th Name of school: [employer] person: n/a  Risk to self with the past 6 months Suicidal Ideation: No (Denied, group home reports suicidal ideation) Has patient been a risk to self within the past 6 months prior to admission? : No Suicidal Intent: No Has patient had any suicidal intent within the past 6 months  prior to admission? : No Is patient at risk for suicide?: Yes Suicidal Plan?: No-Not Currently/Within Last 6 Months (Reports by group home that he asked mother for a gun) Has patient had any suicidal plan within the past 6 months prior to admission? : No Access to Means: No What has been your use of drugs/alcohol within the last 12 months?: None Previous Attempts/Gestures: No How many times?: 0 Other Self Harm Risks: none reported Triggers for Past Attempts: None  known Intentional Self Injurious Behavior: None Family Suicide History: No Recent stressful life event(s):  (None reported) Persecutory voices/beliefs?: No Depression: No Depression Symptoms:  (None reported) Substance abuse history and/or treatment for substance abuse?: No Suicide prevention information given to non-admitted patients: Not applicable  Risk to Others within the past 6 months Homicidal Ideation: No Does patient have any lifetime risk of violence toward others beyond the six months prior to admission? : No Thoughts of Harm to Others: No Current Homicidal Intent: No Current Homicidal Plan: No Access to Homicidal Means: No Identified Victim: None reported History of harm to others?: No Assessment of Violence: None Noted Violent Behavior Description: none reported Does patient have access to weapons?: No Criminal Charges Pending?: No Does patient have a court date: No Is patient on probation?: No  Psychosis Hallucinations: Auditory, Visual, With command Delusions: None noted  Mental Status Report Appearance/Hygiene: In scrubs, Bizarre Eye Contact: Poor Motor Activity: Unremarkable Speech: Tangential, Incoherent Level of Consciousness: Alert Mood: Pleasant, Suspicious Affect: Flat Anxiety Level: None Thought Processes: Tangential Judgement: Impaired Orientation: Person, Place, Situation Obsessive Compulsive Thoughts/Behaviors: None  Cognitive Functioning Concentration: Unable to Assess Memory: Unable to Assess Insight: Poor Impulse Control: Poor Appetite: Good Sleep: Unable to Assess Vegetative Symptoms: None  ADLScreening [facility] Assessment Services) Patient's cognitive ability adequate to safely complete daily activities?: Yes Patient able to express need for assistance with ADLs?: Yes Independently performs ADLs?: Yes (appropriate for developmental age)  Prior Inpatient Therapy Prior Inpatient Therapy: Yes Prior Therapy Dates: Unsure Prior  Therapy Facilty/Provider(s): [facility] Reason for Treatment: Schizoaffective Disorder  Prior Outpatient Therapy Prior Outpatient Therapy:  (Unknown) Does patient have an ACCT team?: No Does patient have Intensive In-House Services?  : No Does patient have [facility] services? : No Does patient have P4CC services?: No  ADL Screening (condition at time of admission) Patient's cognitive ability adequate to safely complete daily activities?: Yes Patient able to express need for assistance with ADLs?: Yes Independently performs ADLs?: Yes (appropriate for developmental age)       Abuse/Neglect Assessment (Assessment to be complete while patient is alone) Physical Abuse: Denies Verbal Abuse: Denies Sexual Abuse: Denies Exploitation of patient/patient's resources: Denies Self-Neglect: Denies Values / Beliefs Cultural Requests During Hospitalization: None Spiritual Requests During Hospitalization: None        Additional Information 1:1 In Past 12 Months?: No CIRT Risk: No Elopement Risk: No Does patient have medical clearance?: Yes     Disposition:  Disposition Initial Assessment Completed for this Encounter: Yes Disposition of Patient: Referred to (To be seen by the psychiatrist)  On Site Evaluation by:   Reviewed with Physician:    Justice Deeds 01/11/2015 11:25 PM

## 2015-01-11 NOTE — ED Notes (Signed)
BEHAVIORAL HEALTH ROUNDING Patient sleeping: No. Patient alert and oriented: yes Behavior appropriate: Yes.  ; If no, describe:  Nutrition and fluids offered: Yes  Toileting and hygiene offered: Yes  Sitter present: yes Law enforcement present: Yes  

## 2015-01-11 NOTE — ED Notes (Signed)
Need medication list and history from group home.

## 2015-01-11 NOTE — ED Provider Notes (Signed)
[facility] Emergency Department Provider Note  ____________________________________________  Time seen: Approximately 730 PM  I have reviewed the triage vital signs and the nursing notes.   HISTORY  Chief Complaint Psychiatric Evaluation    HPI [name] is a 34 y.o. male with a history of bipolar disorder as well as schizophrenia who is presenting tonight with agitated behavior at his group home. He was dropped off at the emergency department by his group home after he was found to be asking for guns and hearing voices. The group home worker that brought him in, he stated that he is hearing voices and he would like to harm the voices. He says that the voices are saying they are going to steal his muscles and also his hands. Voiced that he was suicidal at his group home but denies it here. Also denying homicidal ideation at this point. Denying any pain but says that he has multiple episodes of diarrhea that he has been having for the past 2 months. Denies any recent history of antibiotic use.Says that he is up to 12 episodes of diarrhea per day. Says that it is like "water." Denies any blood in the stool.   Past Medical History  Diagnosis Date  . Bipolar 1 disorder   . Schizo affective schizophrenia   . Depression     Patient Active Problem List   Diagnosis Date Noted  . Schizoaffective disorder, bipolar type 08/31/2014  . Obesity 08/31/2014  . HTN (hypertension) 08/31/2014  . COPD (chronic obstructive pulmonary disease) 08/31/2014  . Sinus tachycardia 08/31/2014  . Constipation 08/31/2014  . Hepatitis C 08/31/2014    Past Surgical History  Procedure Laterality Date  . Right knee -patela operation      Current Outpatient Rx  Name  Route  Sig  Dispense  Refill  . cloZAPine (CLOZARIL) 100 MG tablet   Oral   Take 3.5 tablets (350 mg total) by mouth at bedtime.   105 tablet   0   . cloZAPine (CLOZARIL) 200 MG tablet   Oral   Take 1 tablet  (200 mg total) by mouth daily.   30 tablet   0   . docusate sodium (COLACE) 100 MG capsule   Oral   Take 2 capsules (200 mg total) by mouth 2 (two) times daily.   10 capsule   0   . fluticasone (FLOVENT HFA) 44 MCG/ACT inhaler   Inhalation   Inhale 2 puffs into the lungs 2 (two) times daily.         Marland Kitchen lithium carbonate 300 MG capsule   Oral   Take 300-600 mg by mouth 2 (two) times daily. 1 capsule every morning and 2 capsules daily at bedtime         . metFORMIN (GLUCOPHAGE) 500 MG tablet   Oral   Take 1 tablet (500 mg total) by mouth 2 (two) times daily with a meal.   60 tablet   0   . metoprolol succinate (TOPROL-XL) 50 MG 24 hr tablet   Oral   Take 1 tablet (50 mg total) by mouth daily. Take with or immediately following a meal.   30 tablet   0   . nicotine (NICOTROL) 10 MG inhaler   Inhalation   Inhale 1 cartridge (1 continuous puffing total) into the lungs as needed for smoking cessation (Q1h PRN for nicotine craving. Puff on cartridge for about 20 minutes. Limit 6-16 per 24hr).   42 each   0   .  senna (SENOKOT) 8.6 MG TABS tablet   Oral   Take 2 tablets (17.2 mg total) by mouth at bedtime.   120 each   0     Allergies Review of patient's allergies indicates no known allergies.  No family history on file.  Social History Social History  Substance Use Topics  . Smoking status: Current Every Day Smoker  . Smokeless tobacco: None  . Alcohol Use: No    Review of Systems Constitutional: No fever/chills Eyes: No visual changes. ENT: No sore throat. Cardiovascular: Denies chest pain. Respiratory: Denies shortness of breath. Gastrointestinal: No abdominal pain.  No nausea, no vomiting.  No constipation. Genitourinary: Negative for dysuria. Musculoskeletal: Negative for back pain. Skin: Negative for rash. Neurological: Negative for headaches, focal weakness or numbness.  10-point ROS otherwise  negative.  ____________________________________________   PHYSICAL EXAM:  VITAL SIGNS: ED Triage Vitals  Enc Vitals Group     BP 01/11/15 1849 127/110 mmHg     Pulse Rate 01/11/15 1849 117     Resp 01/11/15 1849 20     Temp 01/11/15 1849 98.7 F (37.1 C)     Temp Source 01/11/15 1849 Oral     SpO2 01/11/15 1849 96 %     Weight 01/11/15 1849 203 lb (92.08 kg)     Height 01/11/15 1849 5\' 8"  (1.727 m)     Head Cir --      Peak Flow --      Pain Score 01/11/15 1855 8     Pain Loc --      Pain Edu? --      Excl. in GC? --     Constitutional: Alert and oriented. Well appearing and in no acute distress. Eyes: Conjunctivae are normal. PERRL. EOMI. Head: Atraumatic. Nose: No congestion/rhinnorhea. Mouth/Throat: Mucous membranes are moist.  Oropharynx non-erythematous. Neck: No stridor.   Cardiovascular: Normal rate, regular rhythm. Grossly normal heart sounds.  Good peripheral circulation. Respiratory: Normal respiratory effort.  No retractions. Lungs CTAB. Gastrointestinal: Soft and nontender. No distention. No abdominal bruits. No CVA tenderness. Musculoskeletal: No lower extremity tenderness nor edema.  No joint effusions. Neurologic:  Normal speech and language. No gross focal neurologic deficits are appreciated. No gait instability. Skin:  Skin is warm, dry and intact. No rash noted. Psychiatric: Tangential and pressured speech. Seen punching kicking at the air in his stretcher. ____________________________________________   LABS (all labs ordered are listed, but only abnormal results are displayed)  Labs Reviewed  COMPREHENSIVE METABOLIC PANEL - Abnormal; Notable for the following:    Glucose, Bld 101 (*)    BUN 21 (*)    AST 75 (*)    ALT 165 (*)    All other components within normal limits  ACETAMINOPHEN LEVEL - Abnormal; Notable for the following:    Acetaminophen (Tylenol), Serum <10 (*)    All other components within normal limits  CBC - Abnormal; Notable for  the following:    WBC 14.8 (*)    RDW 14.6 (*)    All other components within normal limits  URINE DRUG SCREEN, QUALITATIVE ([facility] ONLY) - Abnormal; Notable for the following:    Tricyclic, Ur Screen POSITIVE (*)    Amphetamines, Ur Screen NEGATIVE (*)    MDMA (Ecstasy)Ur Screen NEGATIVE (*)    Cocaine Metabolite,Ur [place] NEGATIVE (*)    Opiate, Ur Screen NEGATIVE (*)    Phencyclidine (PCP) Ur S NEGATIVE (*)    Cannabinoid 50 Ng, Ur  NEGATIVE (*)    Barbiturates,  Ur Screen NEGATIVE (*)    Benzodiazepine, Ur Scrn NEGATIVE (*)    Methadone Scn, Ur NEGATIVE (*)    All other components within normal limits  LITHIUM LEVEL - Abnormal; Notable for the following:    Lithium Lvl <0.06 (*)    All other components within normal limits  C DIFFICILE QUICK SCREEN W PCR REFLEX  ETHANOL  SALICYLATE LEVEL   ____________________________________________  EKG   ____________________________________________  RADIOLOGY   ____________________________________________   PROCEDURES    ____________________________________________   INITIAL IMPRESSION / ASSESSMENT AND PLAN / ED COURSE  Pertinent labs & imaging results that were available during my care of the patient were reviewed by me and considered in my medical decision making (see chart for details).  I did complete an involuntary commitment for this patient. He will also receive 1 mg of by mouth Ativan for his ongoing agitation and manic behavior. ____________________________________________   FINAL CLINICAL IMPRESSION(S) / ED DIAGNOSES  Acute mania. Acute auditory hallucinations.    Myrna Blazer, MD 01/11/15 2252

## 2015-01-11 NOTE — ED Notes (Addendum)
Pt BIB Group home worker who states that he has been stating he was suicidal asking for guns and hearing voicing. Pt denies being SI at this time but states he is hearing voice and he would like to harm them (the voices). Pt aggitated stating that the voices are saying they are going to steal his muscles.  Pt from the group home [facility]. [phone] Quinton Pulliam   Carl Best ( group home worker)  [phone]

## 2015-01-11 NOTE — ED Notes (Signed)

## 2015-01-12 DIAGNOSIS — F250 Schizoaffective disorder, bipolar type: Secondary | ICD-10-CM | POA: Diagnosis not present

## 2015-01-12 MED ORDER — LINACLOTIDE 145 MCG PO CAPS
145.0000 ug | ORAL_CAPSULE | Freq: Every day | ORAL | Status: DC
  Administered 2015-01-12: 145 ug via ORAL
  Filled 2015-01-12 (×2): qty 1

## 2015-01-12 MED ORDER — NICOTINE 10 MG IN INHA: 1.0000 | RESPIRATORY_TRACT | Status: DC | PRN

## 2015-01-12 MED ORDER — NICOTINE 21 MG/24HR TD PT24
21.0000 mg | MEDICATED_PATCH | Freq: Once | TRANSDERMAL | Status: DC
  Administered 2015-01-12: 21 mg via TRANSDERMAL

## 2015-01-12 MED ORDER — CLOZAPINE 100 MG PO TABS
200.0000 mg | ORAL_TABLET | Freq: Every day | ORAL | Status: DC
  Administered 2015-01-12: 200 mg via ORAL
  Filled 2015-01-12: qty 8

## 2015-01-12 MED ORDER — METOPROLOL SUCCINATE ER 50 MG PO TB24
50.0000 mg | ORAL_TABLET | Freq: Every day | ORAL | Status: DC
  Administered 2015-01-12: 50 mg via ORAL
  Filled 2015-01-12: qty 1

## 2015-01-12 MED ORDER — NICOTINE 21 MG/24HR TD PT24
MEDICATED_PATCH | TRANSDERMAL | Status: AC
  Administered 2015-01-12: 21 mg via TRANSDERMAL
  Filled 2015-01-12: qty 1

## 2015-01-12 MED ORDER — METFORMIN HCL 500 MG PO TABS
500.0000 mg | ORAL_TABLET | Freq: Two times a day (BID) | ORAL | Status: DC
  Filled 2015-01-12: qty 1

## 2015-01-12 MED ORDER — CLOZAPINE 100 MG PO TABS: 100.0000 mg | ORAL_TABLET | Freq: Every day | ORAL | Status: DC

## 2015-01-12 MED ORDER — BENZTROPINE MESYLATE 1 MG PO TABS
0.5000 mg | ORAL_TABLET | Freq: Two times a day (BID) | ORAL | Status: DC
  Administered 2015-01-12: 0.5 mg via ORAL
  Filled 2015-01-12: qty 1

## 2015-01-12 NOTE — ED Notes (Signed)
BEHAVIORAL HEALTH ROUNDING Patient sleeping: Yes.   Patient alert and oriented: not applicable SLEEPING Behavior appropriate: Yes.  ; If no, describe: SLEEPING Nutrition and fluids offered: No SLEEPING Toileting and hygiene offered: NoSLEEPING Sitter present: not applicable Law enforcement present: Yes [facility] 

## 2015-01-12 NOTE — ED Notes (Signed)
Pt resting in bed in no distress.

## 2015-01-12 NOTE — ED Provider Notes (Signed)
-----------------------------------------   6:54 AM on 01/12/2015 -----------------------------------------   Blood pressure 127/110, pulse 117, temperature 98.7 F (37.1 C), temperature source Oral, resp. rate 20, height  (1.727 m), weight 203 lb (92.08 kg), SpO2 96 %.  The patient had no acute events since last update.  Calm and cooperative at this time.  Disposition is pending per Psychiatry/Behavioral Medicine team recommendations.     Rebecka Apley, MD 01/12/15 [phone]

## 2015-01-12 NOTE — ED Notes (Signed)
BEHAVIORAL HEALTH ROUNDING Patient sleeping: No. Patient alert and oriented: yes Behavior appropriate: Yes.  ;  Nutrition and fluids offered: Yes  Toileting and hygiene offered: Yes  Sitter present: yes Law enforcement present: Yes  

## 2015-01-12 NOTE — ED Notes (Signed)
BEHAVIORAL HEALTH ROUNDING Patient sleeping: No. Patient alert and oriented: yes Behavior appropriate: Yes.  ; If no, describe:  Nutrition and fluids offered: Yes  Toileting and hygiene offered: Yes  Sitter present: no Law enforcement present: Yes  

## 2015-01-12 NOTE — ED Notes (Signed)
BEHAVIORAL HEALTH ROUNDING Patient sleeping: No. Patient alert and oriented: yes Behavior appropriate: Yes.  ; If no, describe:  Nutrition and fluids offered: Yes  Toileting and hygiene offered: Yes  Sitter present: ED Tech present for every 15 minute check Law enforcement present: Yes

## 2015-01-12 NOTE — ED Notes (Signed)

## 2015-01-12 NOTE — ED Notes (Signed)
BEHAVIORAL HEALTH ROUNDING Patient sleeping: Yes.   Patient alert and oriented: yes Behavior appropriate: Yes.   Nutrition and fluids offered: Yes  Toileting and hygiene offered: Yes  Sitter present: yes Law enforcement present: Yes   ENVIRONMENTAL ASSESSMENT Potentially harmful objects out of patient reach: Yes.   Personal belongings secured: Yes.   Patient dressed in hospital provided attire only: Yes.   Plastic bags out of patient reach: Yes.   Patient care equipment (cords, cables, call bells, lines, and drains) shortened, removed, or accounted for: Yes.   Equipment and supplies removed from bottom of stretcher: Yes.   Potentially toxic materials out of patient reach: Yes.   Sharps container removed or out of patient reach: Yes.     Pt resting in bed with eyes closed in no distress

## 2015-01-12 NOTE — ED Notes (Signed)
Pt ate 100% of breakfast tray.

## 2015-01-12 NOTE — Consult Note (Signed)
[facility] Consult   Reason for Consult:  Consult for this 34 year old man with a history of schizophrenia or schizoaffective disorder supposedly talking about killing himself Referring Physician:  Marcelene Butte Patient Identification: [name] MRN:  [id number] Principal Diagnosis: Schizoaffective disorder, bipolar type Diagnosis:   Patient Active Problem List   Diagnosis Date Noted  . Schizoaffective disorder, bipolar type [F25.0] 08/31/2014  . Obesity [E66.9] 08/31/2014  . HTN (hypertension) [I10] 08/31/2014  . COPD (chronic obstructive pulmonary disease) [J44.9] 08/31/2014  . Sinus tachycardia [I47.1] 08/31/2014  . Constipation [K59.00] 08/31/2014  . Hepatitis C [B19.20] 08/31/2014    Total Time spent with patient: 1 hour  Subjective:   [name] is a 34 y.o. male patient admitted with "my stomach is messed up".  HPI:  This is a 34 year old man known to [place] from previous admissions with a history of schizophrenia. He was sent here on involuntary commitment that states that he has been making suicidal statements and has been hostile. The patient tells me his chief complaint is his stomach discomfort. He's been having loose bowel movements for months now. He is frustrated because when he has to go to the day program at [place] he claims that he is having to go to the bathroom more than 10 times a day. He finds this embarrassing and upsetting and wants to stay home. The group home does not allow him to do this and so he gets more frustrated. Patient called his parents and ask if he could come back and stay at home for a while. He admits that he asked him if they had a gun but says that this was not because he wanted a gun but just to make sure they didn't have one. He says he does not want to die and is not having any thoughts of killing himself. Admits that he can lose his temper with other people when they argue with him but denies any thoughts about hurting anybody  else. He has chronic auditory hallucinations that have been going on for years despite his medication. He says that he has not recently been using any alcohol or drugs and he has been compliant with his medicines.  Past psychiatric history: Several prior psychiatric admissions including a believe some lengthy ones. It took several medication trials before he got the clozapine. Has gotten some benefit from being on clozapine treatment. Does have a history of violence and aggression when psychotic. Has had some irresponsible behavior towards himself but I don't believe he is made serious suicide attempts. Diagnosis either schizophrenia or schizoaffective disorder. Follows up with Dr. Kasandra Knudsen  Medical history: He has hepatitis C. He is looking into possibly getting treatment for it. He also is taking medication for elevated triglycerides. Has high blood pressure history of tachycardia. Obesity and COPD.  Social history: Patient lives in a group home. He stays in pretty regular contact with his biological parents.  Family history: Denies any family history of mental illness  Substance abuse history: Past history of heavy marijuana use but is not currently using marijuana or alcohol regularly. HPI Elements:   Quality:  Psychosis suicidal ideation. Severity:  Potentially life threatening but transient and probably not serious. Timing:  Bad the last couple days although it's been escalating since his stomach is been bothering him. Duration:  Chronic intermittent. Context:  Upset about his physical condition. Chronic mental illness..  Past Medical History:  Past Medical History  Diagnosis Date  . Bipolar 1 disorder   .  Schizo affective schizophrenia   . Depression     Past Surgical History  Procedure Laterality Date  . Right knee -patela operation     Family History: No family history on file. Social History:  History  Alcohol Use No     History  Drug Use  . Yes  . Special: Marijuana     Comment: in remission    Social History   Social History  . Marital Status: Single    Spouse Name: N/A  . Number of Children: N/A  . Years of Education: N/A   Social History Main Topics  . Smoking status: Current Every Day Smoker  . Smokeless tobacco: None  . Alcohol Use: No  . Drug Use: Yes    Special: Marijuana     Comment: in remission  . Sexual Activity: Not Asked   Other Topics Concern  . None   Social History Narrative   Additional Social History:    History of alcohol / drug use?: No history of alcohol / drug abuse                     Allergies:  No Known Allergies  Labs:  Results for orders placed or performed during the hospital encounter of 01/11/15 (from the past 48 hour(s))  Urine Drug Screen, Qualitative ([place] only)     Status: Abnormal   Collection Time: 01/11/15  6:14 PM  Result Value Ref Range   Tricyclic, Ur Screen POSITIVE (A) NONE DETECTED   Amphetamines, Ur Screen NEGATIVE (A) NONE DETECTED   MDMA (Ecstasy)Ur Screen NEGATIVE (A) NONE DETECTED   Cocaine Metabolite,Ur Spencer NEGATIVE (A) NONE DETECTED   Opiate, Ur Screen NEGATIVE (A) NONE DETECTED   Phencyclidine (PCP) Ur S NEGATIVE (A) NONE DETECTED   Cannabinoid 50 Ng, Ur Richardson NEGATIVE (A) NONE DETECTED   Barbiturates, Ur Screen NEGATIVE (A) NONE DETECTED   Benzodiazepine, Ur Scrn NEGATIVE (A) NONE DETECTED   Methadone Scn, Ur NEGATIVE (A) NONE DETECTED    Comment: (NOTE) 673  Tricyclics, urine               Cutoff 1000 ng/mL 200  Amphetamines, urine             Cutoff 1000 ng/mL 300  MDMA (Ecstasy), urine           Cutoff 500 ng/mL 400  Cocaine Metabolite, urine       Cutoff 300 ng/mL 500  Opiate, urine                   Cutoff 300 ng/mL 600  Phencyclidine (PCP), urine      Cutoff 25 ng/mL 700  Cannabinoid, urine              Cutoff 50 ng/mL 800  Barbiturates, urine             Cutoff 200 ng/mL 900  Benzodiazepine, urine           Cutoff 200 ng/mL 1000 Methadone, urine                 Cutoff 300 ng/mL 1100 1200 The urine drug screen provides only a preliminary, unconfirmed 1300 analytical test result and should not be used for non-medical 1400 purposes. Clinical consideration and professional judgment should 1500 be applied to any positive drug screen result due to possible 1600 interfering substances. A more specific alternate chemical method 1700 must be used in order to obtain a confirmed analytical result.  1800  Gas chromato graphy / mass spectrometry (GC/MS) is the preferred 1900 confirmatory method.   Comprehensive metabolic panel     Status: Abnormal   Collection Time: 01/11/15  7:13 PM  Result Value Ref Range   Sodium 139 135 - 145 mmol/L   Potassium 4.1 3.5 - 5.1 mmol/L   Chloride 107 101 - 111 mmol/L   CO2 23 22 - 32 mmol/L   Glucose, Bld 101 (H) 65 - 99 mg/dL   BUN 21 (H) 6 - 20 mg/dL   Creatinine, Ser 0.83 0.61 - 1.24 mg/dL   Calcium 9.4 8.9 - 10.3 mg/dL   Total Protein 7.3 6.5 - 8.1 g/dL   Albumin 4.5 3.5 - 5.0 g/dL   AST 75 (H) 15 - 41 U/L   ALT 165 (H) 17 - 63 U/L   Alkaline Phosphatase 65 38 - 126 U/L   Total Bilirubin 0.3 0.3 - 1.2 mg/dL   GFR calc non Af Amer >60 >60 mL/min   GFR calc Af Amer >60 >60 mL/min    Comment: (NOTE) The eGFR has been calculated using the CKD EPI equation. This calculation has not been validated in all clinical situations. eGFR's persistently <60 mL/min signify possible Chronic Kidney Disease.    Anion gap 9 5 - 15  Ethanol (ETOH)     Status: None   Collection Time: 01/11/15  7:13 PM  Result Value Ref Range   Alcohol, Ethyl (B) <5 <5 mg/dL    Comment:        LOWEST DETECTABLE LIMIT FOR SERUM ALCOHOL IS 5 mg/dL FOR MEDICAL PURPOSES ONLY   Salicylate level     Status: None   Collection Time: 01/11/15  7:13 PM  Result Value Ref Range   Salicylate Lvl <6.9 2.8 - 30.0 mg/dL  Acetaminophen level     Status: Abnormal   Collection Time: 01/11/15  7:13 PM  Result Value Ref Range   Acetaminophen (Tylenol),  Serum <10 (L) 10 - 30 ug/mL    Comment:        THERAPEUTIC CONCENTRATIONS VARY SIGNIFICANTLY. A RANGE OF 10-30 ug/mL MAY BE AN EFFECTIVE CONCENTRATION FOR MANY PATIENTS. HOWEVER, SOME ARE BEST TREATED AT CONCENTRATIONS OUTSIDE THIS RANGE. ACETAMINOPHEN CONCENTRATIONS >150 ug/mL AT 4 HOURS AFTER INGESTION AND >50 ug/mL AT 12 HOURS AFTER INGESTION ARE OFTEN ASSOCIATED WITH TOXIC REACTIONS.   CBC     Status: Abnormal   Collection Time: 01/11/15  7:13 PM  Result Value Ref Range   WBC 14.8 (H) 3.8 - 10.6 K/uL   RBC 5.04 4.40 - 5.90 MIL/uL   Hemoglobin 15.6 13.0 - 18.0 g/dL   HCT 45.7 40.0 - 52.0 %   MCV 90.8 80.0 - 100.0 fL   MCH 30.9 26.0 - 34.0 pg   MCHC 34.0 32.0 - 36.0 g/dL   RDW 14.6 (H) 11.5 - 14.5 %   Platelets 250 150 - 440 K/uL  Lithium level     Status: Abnormal   Collection Time: 01/11/15  7:13 PM  Result Value Ref Range   Lithium Lvl <0.06 (L) 0.60 - 1.20 mmol/L    Vitals: Blood pressure 122/90, pulse 94, temperature 97.6 F (36.4 C), temperature source Oral, resp. rate 16, height $RemoveBe'5\' 8"'TBgQaQkJO$  (1.727 m), weight 92.08 kg (203 lb), SpO2 97 %.  Risk to Self: Suicidal Ideation: No (Denied, group home reports suicidal ideation) Suicidal Intent: No Is patient at risk for suicide?: Yes Suicidal Plan?: No-Not Currently/Within Last 6 Months (Reports by group home that he asked mother  for a gun) Access to Means: No What has been your use of drugs/alcohol within the last 12 months?: None How many times?: 0 Other Self Harm Risks: none reported Triggers for Past Attempts: None known Intentional Self Injurious Behavior: None Risk to Others: Homicidal Ideation: No Thoughts of Harm to Others: No Current Homicidal Intent: No Current Homicidal Plan: No Access to Homicidal Means: No Identified Victim: None reported History of harm to others?: No Assessment of Violence: None Noted Violent Behavior Description: none reported Does patient have access to weapons?: No Criminal  Charges Pending?: No Does patient have a court date: No Prior Inpatient Therapy: Prior Inpatient Therapy: Yes Prior Therapy Dates: Unsure Prior Therapy Facilty/Provider(s): [facility] Reason for Treatment: Schizoaffective Disorder Prior Outpatient Therapy: Prior Outpatient Therapy:  (Unknown) Does patient have an ACCT team?: No Does patient have Intensive In-House Services?  : No Does patient have [facility] services? : No Does patient have P4CC services?: No  Current Facility-Administered Medications  Medication Dose Route Frequency Provider Last Rate Last Dose  . benztropine (COGENTIN) tablet 0.5 mg  0.5 mg Oral BID Daymon Larsen, MD   0.5 mg at 01/12/15 1037  . cloZAPine (CLOZARIL) tablet 100 mg  100 mg Oral QHS Daymon Larsen, MD      . cloZAPine (CLOZARIL) tablet 200 mg  200 mg Oral Daily Daymon Larsen, MD   200 mg at 01/12/15 1125  . Linaclotide (LINZESS) capsule 145 mcg  145 mcg Oral Daily Daymon Larsen, MD   145 mcg at 01/12/15 1125  . metFORMIN (GLUCOPHAGE) tablet 500 mg  500 mg Oral BID WC Daymon Larsen, MD      . metoprolol succinate (TOPROL-XL) 24 hr tablet 50 mg  50 mg Oral Daily Daymon Larsen, MD   50 mg at 01/12/15 1037  . nicotine (NICODERM CQ - dosed in mg/24 hours) patch 21 mg  21 mg Transdermal Once Gonzella Lex, MD   21 mg at 01/12/15 1603  . nicotine (NICOTROL) 10 MG inhaler 1 continuous puffing  1 continuous puffing Inhalation PRN Gonzella Lex, MD       Current Outpatient Prescriptions  Medication Sig Dispense Refill  . ARIPiprazole (ABILIFY MAINTENA) 400 MG SUSR Inject 400 mg into the muscle every 30 (thirty) days.    . benztropine (COGENTIN) 0.5 MG tablet Take 0.5 mg by mouth 2 (two) times daily.    . cloZAPine (CLOZARIL) 100 MG tablet Take 3.5 tablets (350 mg total) by mouth at bedtime. 105 tablet 0  . cloZAPine (CLOZARIL) 200 MG tablet Take 1 tablet (200 mg total) by mouth daily. 30 tablet 0  . cyclobenzaprine (FLEXERIL) 10 MG tablet Take 10 mg by mouth 2  (two) times daily.    . fluticasone (FLOVENT HFA) 44 MCG/ACT inhaler Inhale 2 puffs into the lungs 2 (two) times daily.    . Linaclotide (LINZESS) 145 MCG CAPS capsule Take 145 mcg by mouth daily.    . metFORMIN (GLUCOPHAGE) 500 MG tablet Take 1 tablet (500 mg total) by mouth 2 (two) times daily with a meal. 60 tablet 0  . metoprolol succinate (TOPROL-XL) 50 MG 24 hr tablet Take 1 tablet (50 mg total) by mouth daily. Take with or immediately following a meal. 30 tablet 0  . naproxen (NAPROSYN) 500 MG tablet Take 500 mg by mouth 2 (two) times daily with a meal.    . propranolol (INDERAL) 10 MG tablet Take 10 mg by mouth 2 (two) times daily.    Marland Kitchen  docusate sodium (COLACE) 100 MG capsule Take 2 capsules (200 mg total) by mouth 2 (two) times daily. 10 capsule 0  . nicotine (NICOTROL) 10 MG inhaler Inhale 1 cartridge (1 continuous puffing total) into the lungs as needed for smoking cessation (Q1h PRN for nicotine craving. Puff on cartridge for about 20 minutes. Limit 6-16 per 24hr). 42 each 0  . senna (SENOKOT) 8.6 MG TABS tablet Take 2 tablets (17.2 mg total) by mouth at bedtime. 120 each 0    Musculoskeletal: Strength & Muscle Tone: within normal limits Gait & Station: normal Patient leans: N/A  Psychiatric Specialty Exam: Physical Exam  Nursing note and vitals reviewed. Constitutional: He appears well-developed and well-nourished.  HENT:  Head: Normocephalic and atraumatic.  Eyes: Conjunctivae are normal. Pupils are equal, round, and reactive to light.  Neck: Normal range of motion.  Cardiovascular: Normal heart sounds.   Respiratory: Effort normal.  GI: Soft.  Musculoskeletal: Normal range of motion.  Neurological: He is alert.  Skin: Skin is warm and dry.  Psychiatric: Thought content normal. His affect is blunt. His speech is delayed. He is slowed. Cognition and memory are impaired. He expresses impulsivity.    Review of Systems  Constitutional: Negative.   HENT: Negative.   Eyes:  Negative.   Respiratory: Negative.   Cardiovascular: Negative.   Gastrointestinal: Positive for abdominal pain and diarrhea.  Musculoskeletal: Negative.   Skin: Negative.   Neurological: Negative.   Psychiatric/Behavioral: Positive for hallucinations. Negative for depression, suicidal ideas, memory loss and substance abuse. The patient is nervous/anxious. The patient does not have insomnia.     Blood pressure 122/90, pulse 94, temperature 97.6 F (36.4 C), temperature source Oral, resp. rate 16, height $RemoveBe'5\' 8"'INmfimfCP$  (1.727 m), weight 92.08 kg (203 lb), SpO2 97 %.Body mass index is 30.87 kg/(m^2).  General Appearance: Disheveled  Eye Sport and exercise psychologist::  Fair  Speech:  Clear and Coherent  Volume:  Decreased  Mood:  Euthymic  Affect:  Flat  Thought Process:  Tangential  Orientation:  Full (Time, Place, and Person)  Thought Content:  Hallucinations: Auditory  Suicidal Thoughts:  No  Homicidal Thoughts:  No  Memory:  Immediate;   Fair Recent;   Fair Remote;   Fair  Judgement:  Impaired  Insight:  Shallow  Psychomotor Activity:  Decreased  Concentration:  Fair  Recall:  [employer] of Knowledge:Fair  Language: Good  Akathisia:  No  Handed:  Right  AIMS (if indicated):     Assets:  Communication Skills Desire for Improvement Financial Resources/Insurance Housing Resilience Social Support  ADL's:  Intact  Cognition: WNL  Sleep:      Medical Decision Making: Review of Psycho-Social Stressors (1), Review or order clinical lab tests (1), Established Problem, Worsening (2), Review or order medicine tests (1) and Review of Medication Regimen & Side Effects (2)  Treatment Plan Summary: Medication management and Plan Patient was schizophrenia or schizoaffective disorder. Recently frustrated. Did not actually try to harm himself or anyone else. He has not been aggressive or violent or threatening here. Denies suicidal or homicidal ideation. Agrees to stay on his current medication. No indication for  hospitalization. Does not meet commitment criteria. Patient will be discharged from the emergency room to follow-up in the community with his usual outpatient provider. Ace discussed with emergency room physician.  Plan:  Patient does not meet criteria for psychiatric inpatient admission. Supportive therapy provided about ongoing stressors. Discussed crisis plan, support from social network, calling 911, coming to the Emergency Department,  and calling Suicide Hotline. Disposition: Discharged from the emergency room follow-up with usual outpatient provider  Alethia Berthold 01/12/2015 4:46 PM

## 2015-01-12 NOTE — Discharge Instructions (Signed)
Mr. Arneson was seen by psychiatry. He appears stable and is able to be discharged. Continue his current medications. Return to emergency department if there are further urgent concerns.  Bipolar Disorder Bipolar disorder is a mental illness. The term bipolar disorder actually is used to describe a group of disorders that all share varying degrees of emotional highs and lows that can interfere with daily functioning, such as work, school, or relationships. Bipolar disorder also can lead to drug abuse, hospitalization, and suicide. The emotional highs of bipolar disorder are periods of elation or irritability and high energy. These highs can range from a mild form (hypomania) to a severe form (mania). People experiencing episodes of hypomania may appear energetic, excitable, and highly productive. People experiencing mania may behave impulsively or erratically. They often make poor decisions. They may have difficulty sleeping. The most severe episodes of mania can involve having very distorted beliefs or perceptions about the world and seeing or hearing things that are not real (psychotic delusions and hallucinations).  The emotional lows of bipolar disorder (depression) also can range from mild to severe. Severe episodes of bipolar depression can involve psychotic delusions and hallucinations. Sometimes people with bipolar disorder experience a state of mixed mood. Symptoms of hypomania or mania and depression are both present during this mixed-mood episode. SIGNS AND SYMPTOMS There are signs and symptoms of the episodes of hypomania and mania as well as the episodes of depression. The signs and symptoms of hypomania and mania are similar but vary in severity. They include:  Inflated self-esteem or feeling of increased self-confidence.  Decreased need for sleep.  Unusual talkativeness (rapid or pressured speech) or the feeling of a need to keep talking.  Sensation of racing thoughts or constant  talking, with quick shifts between topics that may or may not be related (flight of ideas).  Decreased ability to focus or concentrate.  Increased purposeful activity, such as work, studies, or social activity, or nonproductive activity, such as pacing, squirming and fidgeting, or finger and toe tapping.  Impulsive behavior and use of poor judgment, resulting in high-risk activities, such as having unprotected sex or spending excessive amounts of money. Signs and symptoms of depression include the following:   Feelings of sadness, hopelessness, or helplessness.  Frequent or uncontrollable episodes of crying.  Lack of feeling anything or caring about anything.  Difficulty sleeping or sleeping too much.  Inability to enjoy the things you used to enjoy.   Desire to be alone all the time.   Feelings of guilt or worthlessness.  Lack of energy or motivation.   Difficulty concentrating, remembering, or making decisions.  Change in appetite or weight beyond normal fluctuations.  Thoughts of death or the desire to harm yourself. DIAGNOSIS  Bipolar disorder is diagnosed through an assessment by your caregiver. Your caregiver will ask questions about your emotional episodes. There are two main types of bipolar disorder. People with type I bipolar disorder have manic episodes with or without depressive episodes. People with type II bipolar disorder have hypomanic episodes and major depressive episodes, which are more serious than mild depression. The type of bipolar disorder you have can make an important difference in how your illness is monitored and treated. Your caregiver may ask questions about your medical history and use of alcohol or drugs, including prescription medication. Certain medical conditions and substances also can cause emotional highs and lows that resemble bipolar disorder (secondary bipolar disorder).  TREATMENT  Bipolar disorder is a long-term illness. It is best  controlled with continuous treatment rather than treatment only when symptoms occur. The following treatments can be prescribed for bipolar disorders:  Medication--Medication can be prescribed by a doctor that is an expert in treating mental disorders (psychiatrists). Medications called mood stabilizers are usually prescribed to help control the illness. Other medications are sometimes added if symptoms of mania, depression, or psychotic delusions and hallucinations occur despite the use of a mood stabilizer.  Talk therapy--Some forms of talk therapy are helpful in providing support, education, and guidance. A combination of medication and talk therapy is best for managing the disorder over time. A procedure in which electricity is applied to your brain through your scalp (electroconvulsive therapy) is used in cases of severe mania when medication and talk therapy do not work or work too slowly. Document Released: 07/25/2000 Document Revised: 08/13/2012 Document Reviewed: 05/14/2012 [facility] Patient Information 2015 [employer], [place]. This information is not intended to replace advice given to you by your health care provider. Make sure you discuss any questions you have with your health care provider.

## 2015-01-12 NOTE — ED Notes (Signed)
BEHAVIORAL HEALTH ROUNDING Patient sleeping: No. Patient alert and oriented: yes Behavior appropriate: Yes.  ; If no, describe:  Nutrition and fluids offered: Yes  Toileting and hygiene offered: Yes  Sitter present: yes Law enforcement present: Yes  

## 2015-01-12 NOTE — ED Notes (Addendum)
Lab rejected stool sample stating that stool was too formed for c-diff testing   stool sample sent was hard and round, no soft stool noted

## 2015-01-12 NOTE — ED Provider Notes (Signed)
-----------------------------------------   4:36 PM on 01/12/2015 -----------------------------------------  Mr. Gaunt has been seen by Dr. Toni Amend, psychiatry. He is stable and able to be discharged back to his group home.  Darien Ramus, MD 01/12/15 [phone]

## 2015-01-12 NOTE — ED Notes (Signed)
BEHAVIORAL HEALTH ROUNDING  Patient sleeping: No.  Patient alert and oriented: yes  Behavior appropriate: Yes. ; If no, describe:  Nutrition and fluids offered: Yes  Toileting and hygiene offered: Yes  Sitter present: not applicable  Law enforcement present: Yes ODS  Pt has had no stools all night. Pt reports no diarrhea since arriving to ED.

## 2015-02-10 ENCOUNTER — Other Ambulatory Visit: Payer: Self-pay | Admitting: Student

## 2015-02-10 DIAGNOSIS — B182 Chronic viral hepatitis C: Secondary | ICD-10-CM

## 2015-02-13 ENCOUNTER — Ambulatory Visit
Admission: RE | Admit: 2015-02-13 | Discharge: 2015-02-13 | Disposition: A | Discharge status: Home / Self Care | Payer: Medicaid Other | Source: Ambulatory Visit | Attending: Student | Admitting: Student

## 2015-02-13 DIAGNOSIS — B182 Chronic viral hepatitis C: Secondary | ICD-10-CM | POA: Insufficient documentation

## 2015-03-13 ENCOUNTER — Ambulatory Visit
Admission: RE | Admit: 2015-03-13 | Discharge: 2015-03-13 | Disposition: A | Discharge status: Home / Self Care | Payer: Medicaid Other | Source: Ambulatory Visit | Attending: Student | Admitting: Student

## 2015-03-13 DIAGNOSIS — B182 Chronic viral hepatitis C: Secondary | ICD-10-CM | POA: Insufficient documentation

## 2015-04-01 ENCOUNTER — Encounter: Payer: Self-pay | Admitting: Internal Medicine

## 2015-04-01 ENCOUNTER — Telehealth: Payer: Self-pay | Admitting: *Deleted

## 2015-04-01 ENCOUNTER — Inpatient Hospital Stay: Payer: Medicaid Other

## 2015-04-01 ENCOUNTER — Inpatient Hospital Stay: Payer: Medicaid Other | Attending: Internal Medicine | Admitting: Internal Medicine

## 2015-04-01 VITALS — BP 127/83 | HR 109 | Temp 98.1°F | Resp 18 | Ht 68.0 in | Wt 198.2 lb

## 2015-04-01 DIAGNOSIS — F419 Anxiety disorder, unspecified: Secondary | ICD-10-CM | POA: Insufficient documentation

## 2015-04-01 DIAGNOSIS — Z7984 Long term (current) use of oral hypoglycemic drugs: Secondary | ICD-10-CM | POA: Diagnosis not present

## 2015-04-01 DIAGNOSIS — G4730 Sleep apnea, unspecified: Secondary | ICD-10-CM | POA: Insufficient documentation

## 2015-04-01 DIAGNOSIS — F319 Bipolar disorder, unspecified: Secondary | ICD-10-CM | POA: Insufficient documentation

## 2015-04-01 DIAGNOSIS — F17210 Nicotine dependence, cigarettes, uncomplicated: Secondary | ICD-10-CM | POA: Insufficient documentation

## 2015-04-01 DIAGNOSIS — J449 Chronic obstructive pulmonary disease, unspecified: Secondary | ICD-10-CM | POA: Diagnosis not present

## 2015-04-01 DIAGNOSIS — B182 Chronic viral hepatitis C: Secondary | ICD-10-CM | POA: Diagnosis not present

## 2015-04-01 DIAGNOSIS — D72829 Elevated white blood cell count, unspecified: Secondary | ICD-10-CM | POA: Diagnosis not present

## 2015-04-01 DIAGNOSIS — F259 Schizoaffective disorder, unspecified: Secondary | ICD-10-CM | POA: Insufficient documentation

## 2015-04-01 DIAGNOSIS — E785 Hyperlipidemia, unspecified: Secondary | ICD-10-CM | POA: Insufficient documentation

## 2015-04-01 DIAGNOSIS — Z79899 Other long term (current) drug therapy: Secondary | ICD-10-CM | POA: Insufficient documentation

## 2015-04-01 DIAGNOSIS — E119 Type 2 diabetes mellitus without complications: Secondary | ICD-10-CM | POA: Diagnosis not present

## 2015-04-01 LAB — CBC WITH DIFFERENTIAL/PLATELET
Basophils Absolute: 0.1 10*3/uL (ref 0–0.1)
Basophils Relative: 1 %
EOS ABS: 0.3 10*3/uL (ref 0–0.7)
EOS PCT: 3 %
HCT: 44.5 % (ref 40.0–52.0)
Hemoglobin: 15 g/dL (ref 13.0–18.0)
LYMPHS ABS: 2.6 10*3/uL (ref 1.0–3.6)
Lymphocytes Relative: 26 %
MCH: 30.5 pg (ref 26.0–34.0)
MCHC: 33.6 g/dL (ref 32.0–36.0)
MCV: 90.6 fL (ref 80.0–100.0)
MONO ABS: 0.8 10*3/uL (ref 0.2–1.0)
MONOS PCT: 8 %
Neutro Abs: 6.3 10*3/uL (ref 1.4–6.5)
Neutrophils Relative %: 62 %
PLATELETS: 259 10*3/uL (ref 150–440)
RBC: 4.91 MIL/uL (ref 4.40–5.90)
RDW: 14 % (ref 11.5–14.5)
WBC: 10 10*3/uL (ref 3.8–10.6)

## 2015-04-01 LAB — SEDIMENTATION RATE: Sed Rate: 2 mm/hr (ref 0–15)

## 2015-04-01 LAB — C-REACTIVE PROTEIN: CRP: 1.3 mg/dL — ABNORMAL HIGH (ref <1.0)

## 2015-04-01 NOTE — Progress Notes (Signed)
Pt states that he has congestion and it is better but had it for about 3 weeks. He also states he has diarrhea every morning.  He is on several stool softners according to the list from hosp.  But [facility] and [facility] linzess and Dr. Gretel AcreBerenzon has asked for this med to be discontinued.

## 2015-04-01 NOTE — Progress Notes (Signed)
[place] @ [facility] [phone]) [phone]  Fax:([phone]) [place]: 09/30/1980  MR#: [id number]  QTM#:[id number]  Patient Care Team: Lorelee Market, MD as PCP - General (Family Medicine)  CHIEF COMPLAINT: No chief complaint on file.  Neutrophilic leukocytosis  No history exists.    Oncology Flowsheet 01/11/2015 01/12/2015  diphenhydrAMINE (BENADRYL) PO - 50 mg  LORazepam (ATIVAN) PO 1 mg -    HISTORY OF PRESENT ILLNESS:   [name] is a 34 year old gentleman who is referred to our clinic for evaluation of neutrophilic leukocytosis. [name] suffers from schizoaffective schizophrenia, lives in a group home, and is not aware of the duration of this problem. Review of medical records indicate that his white blood cell count has been elevated since at least February 2013. Until September 2016 the patient was taking lithium, which later was discontinued. He has been taking Linzess for constipation, but he has been complaining of continues watery diarrhea for several days, claiming that he has 7-12 watery bowel movements a day. He denies fevers, chills, blood in stool, abdominal pain, cramps, nausea, vomiting, constipation, night sweats, joint swelling or redness, admits to having "congestion".  REVIEW OF SYSTEMS:   Review of Systems  All other systems reviewed and are negative.    PAST MEDICAL HISTORY: Past Medical History  Diagnosis Date  . Bipolar 1 disorder ([place])   . Schizo affective schizophrenia ([place])   . Depression   . Diabetes mellitus without complication ([place])   . Hepatitis C, chronic ([place])   . Cervicalgia   . COPD (chronic obstructive pulmonary disease) ([place])   . Nicotine dependence   . Anxiety   . Cardiomegaly   . Hyperlipidemia   . Sleep apnea     PAST SURGICAL HISTORY: Past Surgical History  Procedure Laterality Date  . Right knee -patela operation      FAMILY HISTORY History reviewed. No pertinent family history.  ADVANCED  DIRECTIVES:  No flowsheet data found.  HEALTH MAINTENANCE: Social History  Substance Use Topics  . Smoking status: Current Every Day Smoker  . Smokeless tobacco: Not on file  . Alcohol Use: No     No Known Allergies  Current Outpatient Prescriptions  Medication Sig Dispense Refill  . ARIPiprazole (ABILIFY MAINTENA) 400 MG SUSR Inject 400 mg into the muscle every 30 (thirty) days.    . benztropine (COGENTIN) 0.5 MG tablet Take 0.5 mg by mouth 2 (two) times daily.    . cloZAPine (CLOZARIL) 100 MG tablet Take 3.5 tablets (350 mg total) by mouth at bedtime. 105 tablet 0  . cloZAPine (CLOZARIL) 200 MG tablet Take 1 tablet (200 mg total) by mouth daily. 30 tablet 0  . cyclobenzaprine (FLEXERIL) 10 MG tablet Take 10 mg by mouth 2 (two) times daily.    Marland Kitchen docusate sodium (COLACE) 100 MG capsule Take 2 capsules (200 mg total) by mouth 2 (two) times daily. 10 capsule 0  . fluticasone (FLOVENT HFA) 44 MCG/ACT inhaler Inhale 2 puffs into the lungs 2 (two) times daily.    . Linaclotide (LINZESS) 145 MCG CAPS capsule Take 145 mcg by mouth daily.    . metFORMIN (GLUCOPHAGE) 500 MG tablet Take 1 tablet (500 mg total) by mouth 2 (two) times daily with a meal. 60 tablet 0  . metoprolol succinate (TOPROL-XL) 50 MG 24 hr tablet Take 1 tablet (50 mg total) by mouth daily. Take with or immediately following a meal. 30 tablet 0  . naproxen (NAPROSYN) 500 MG tablet  Take 500 mg by mouth 2 (two) times daily with a meal.    . nicotine (NICOTROL) 10 MG inhaler Inhale 1 cartridge (1 continuous puffing total) into the lungs as needed for smoking cessation (Q1h PRN for nicotine craving. Puff on cartridge for about 20 minutes. Limit 6-16 per 24hr). 42 each 0  . propranolol (INDERAL) 10 MG tablet Take 10 mg by mouth 2 (two) times daily.    Marland Kitchen senna (SENOKOT) 8.6 MG TABS tablet Take 2 tablets (17.2 mg total) by mouth at bedtime. 120 each 0   No current facility-administered medications for this visit.     OBJECTIVE:  There were no vitals filed for this visit.   There is no weight on file to calculate BMI.    ECOG FS:0 - Asymptomatic  Physical Exam  Constitutional: He is oriented to person, place, and time and well-developed, well-nourished, and in no distress. No distress.  Body mass index is 30.14 kg/(m^2). Overweighed Caucasian male  HENT:  Head: Normocephalic and atraumatic.  Right Ear: External ear normal.  Left Ear: External ear normal.  Nose: Nose normal.  Mouth/Throat: Oropharynx is clear and moist. No oropharyngeal exudate.  Eyes: Conjunctivae and EOM are normal. Pupils are equal, round, and reactive to light. Right eye exhibits no discharge. Left eye exhibits no discharge. No scleral icterus.  Neck: Normal range of motion. Neck supple. No JVD present. No tracheal deviation present. No thyromegaly present.  Cardiovascular: Normal rate, regular rhythm, normal heart sounds and intact distal pulses.  Exam reveals no gallop and no friction rub.   No murmur heard. Pulmonary/Chest: Effort normal and breath sounds normal. No stridor. No respiratory distress. He has no wheezes. He has no rales. He exhibits no tenderness.  Abdominal: Soft. Bowel sounds are normal. He exhibits no distension and no mass. There is no tenderness. There is no rebound and no guarding.  Genitourinary:  Patient deferred  Musculoskeletal: Normal range of motion. He exhibits no edema or tenderness.  Lymphadenopathy:    He has no cervical adenopathy.  Neurological: He is alert and oriented to person, place, and time. He has normal reflexes. No cranial nerve deficit. He exhibits normal muscle tone. Gait normal. Coordination normal. GCS score is 15.  Skin: Skin is warm and dry. No rash noted. He is not diaphoretic. No erythema. No pallor.  Psychiatric: Mood and judgment normal. His affect is not blunt. He exhibits normal recent memory.     LAB RESULTS:  Recent Results (from the past 2160 hour(s))  Urine Drug  Screen, Qualitative ([facility] only)     Status: Abnormal   Collection Time: 01/11/15  6:14 PM  Result Value Ref Range   Tricyclic, Ur Screen POSITIVE (A) NONE DETECTED   Amphetamines, Ur Screen NEGATIVE (A) NONE DETECTED   MDMA (Ecstasy)Ur Screen NEGATIVE (A) NONE DETECTED   Cocaine Metabolite,Ur [place] NEGATIVE (A) NONE DETECTED   Opiate, Ur Screen NEGATIVE (A) NONE DETECTED   Phencyclidine (PCP) Ur S NEGATIVE (A) NONE DETECTED   Cannabinoid 50 Ng, Ur [place] NEGATIVE (A) NONE DETECTED   Barbiturates, Ur Screen NEGATIVE (A) NONE DETECTED   Benzodiazepine, Ur Scrn NEGATIVE (A) NONE DETECTED   Methadone Scn, Ur NEGATIVE (A) NONE DETECTED    Comment: (NOTE) 119  Tricyclics, urine               Cutoff 1000 ng/mL 200  Amphetamines, urine             Cutoff 1000 ng/mL 300  MDMA (Ecstasy), urine  Cutoff 500 ng/mL 400  Cocaine Metabolite, urine       Cutoff 300 ng/mL 500  Opiate, urine                   Cutoff 300 ng/mL 600  Phencyclidine (PCP), urine      Cutoff 25 ng/mL 700  Cannabinoid, urine              Cutoff 50 ng/mL 800  Barbiturates, urine             Cutoff 200 ng/mL 900  Benzodiazepine, urine           Cutoff 200 ng/mL 1000 Methadone, urine                Cutoff 300 ng/mL 1100 1200 The urine drug screen provides only a preliminary, unconfirmed 1300 analytical test result and should not be used for non-medical 1400 purposes. Clinical consideration and professional judgment should 1500 be applied to any positive drug screen result due to possible 1600 interfering substances. A more specific alternate chemical method 1700 must be used in order to obtain a confirmed analytical result.  1800 Gas chromato graphy / mass spectrometry (GC/MS) is the preferred 1900 confirmatory method.   Comprehensive metabolic panel     Status: Abnormal   Collection Time: 01/11/15  7:13 PM  Result Value Ref Range   Sodium 139 135 - 145 mmol/L   Potassium 4.1 3.5 - 5.1 mmol/L   Chloride 107 101 - 111  mmol/L   CO2 23 22 - 32 mmol/L   Glucose, Bld 101 (H) 65 - 99 mg/dL   BUN 21 (H) 6 - 20 mg/dL   Creatinine, Ser 4.98 0.61 - 1.24 mg/dL   Calcium 9.4 8.9 - 99.1 mg/dL   Total Protein 7.3 6.5 - 8.1 g/dL   Albumin 4.5 3.5 - 5.0 g/dL   AST 75 (H) 15 - 41 U/L   ALT 165 (H) 17 - 63 U/L   Alkaline Phosphatase 65 38 - 126 U/L   Total Bilirubin 0.3 0.3 - 1.2 mg/dL   GFR calc non Af Amer >60 >60 mL/min   GFR calc Af Amer >60 >60 mL/min    Comment: (NOTE) The eGFR has been calculated using the CKD EPI equation. This calculation has not been validated in all clinical situations. eGFR's persistently <60 mL/min signify possible Chronic Kidney Disease.    Anion gap 9 5 - 15  Ethanol (ETOH)     Status: None   Collection Time: 01/11/15  7:13 PM  Result Value Ref Range   Alcohol, Ethyl (B) <5 <5 mg/dL    Comment:        LOWEST DETECTABLE LIMIT FOR SERUM ALCOHOL IS 5 mg/dL FOR MEDICAL PURPOSES ONLY   Salicylate level     Status: None   Collection Time: 01/11/15  7:13 PM  Result Value Ref Range   Salicylate Lvl <4.0 2.8 - 30.0 mg/dL  Acetaminophen level     Status: Abnormal   Collection Time: 01/11/15  7:13 PM  Result Value Ref Range   Acetaminophen (Tylenol), Serum <10 (L) 10 - 30 ug/mL    Comment:        THERAPEUTIC CONCENTRATIONS VARY SIGNIFICANTLY. A RANGE OF 10-30 ug/mL MAY BE AN EFFECTIVE CONCENTRATION FOR MANY PATIENTS. HOWEVER, SOME ARE BEST TREATED AT CONCENTRATIONS OUTSIDE THIS RANGE. ACETAMINOPHEN CONCENTRATIONS >150 ug/mL AT 4 HOURS AFTER INGESTION AND >50 ug/mL AT 12 HOURS AFTER INGESTION ARE OFTEN ASSOCIATED WITH TOXIC REACTIONS.  CBC     Status: Abnormal   Collection Time: 01/11/15  7:13 PM  Result Value Ref Range   WBC 14.8 (H) 3.8 - 10.6 K/uL   RBC 5.04 4.40 - 5.90 MIL/uL   Hemoglobin 15.6 13.0 - 18.0 g/dL   HCT 45.7 40.0 - 52.0 %   MCV 90.8 80.0 - 100.0 fL   MCH 30.9 26.0 - 34.0 pg   MCHC 34.0 32.0 - 36.0 g/dL   RDW 14.6 (H) 11.5 - 14.5 %   Platelets 250  150 - 440 K/uL  Lithium level     Status: Abnormal   Collection Time: 01/11/15  7:13 PM  Result Value Ref Range   Lithium Lvl <0.06 (L) 0.60 - 1.20 mmol/L  CBC with Differential/Platelet     Status: None   Collection Time: 04/01/15  3:06 PM  Result Value Ref Range   WBC 10.0 3.8 - 10.6 K/uL   RBC 4.91 4.40 - 5.90 MIL/uL   Hemoglobin 15.0 13.0 - 18.0 g/dL   HCT 44.5 40.0 - 52.0 %   MCV 90.6 80.0 - 100.0 fL   MCH 30.5 26.0 - 34.0 pg   MCHC 33.6 32.0 - 36.0 g/dL   RDW 14.0 11.5 - 14.5 %   Platelets 259 150 - 440 K/uL   Neutrophils Relative % 62 %   Neutro Abs 6.3 1.4 - 6.5 K/uL   Lymphocytes Relative 26 %   Lymphs Abs 2.6 1.0 - 3.6 K/uL   Monocytes Relative 8 %   Monocytes Absolute 0.8 0.2 - 1.0 K/uL   Eosinophils Relative 3 %   Eosinophils Absolute 0.3 0 - 0.7 K/uL   Basophils Relative 1 %   Basophils Absolute 0.1 0 - 0.1 K/uL           STUDIES: US Liver Elastography W/o Img  03/13/2015  CLINICAL DATA:  Hepatitis C, evaluate for cirrhosis EXAM: ULTRASOUND HEPATIC ELASTOGRAPHY TECHNIQUE: Ultrasound elastography evaluation of the liver was performed. A region of interest was placed in the right lobe of the liver. Following application of a compressive sonographic pulse, shear waves were detected in the adjacent hepatic tissue and the shear wave velocity was calculated. Multiple assessments were performed at the selected site. Median shear wave velocity is correlated to a Metavir fibrosis score. COMPARISON:  Right upper quadrant ultrasound dated 02/13/2015 FINDINGS: Device: Siemens Helix VTQ Transducer 6C1 Patient position: Supine Number of measurements: 10 Hepatic segment:  8 Median velocity:   2.50  m/sec IQR: 0.38 IQR/Median velocity ratio: 0.15 Corresponding Metavir fibrosis score:  F3/F4 Risk of fibrosis: High Limitations of exam: Respiratory motion. Pertinent findings noted on other imaging exams:  None Please note that abnormal shear wave velocities may also be identified in  clinical settings other than with hepatic fibrosis, such as: acute hepatitis, elevated right heart and central venous pressures including use of beta blockers, veno-occlusive disease (Budd-Chiari), infiltrative processes such as mastocytosis/amyloidosis/infiltrative tumor, extrahepatic cholestasis, in the post-prandial state, and liver transplantation. Correlation with patient history, laboratory data, and clinical condition recommended. IMPRESSION: Median hepatic shear wave velocity is calculated at 2.50 m/sec. Corresponding Metavir fibrosis score is  F3/F4. Risk of fibrosis is high. Follow-up: Follow-up advised. Electronically Signed   By: Julian Hy M.D.   On: 03/13/2015 10:38    ASSESSMENT: Neutrophilic leukocytosis-there are several potential reasons for neutrophilic leukocytosis in this situation. Patient used to be on lithium for mood stabilization, and lithium commonly causes neutrophilic leukocytosis. Lithium was discontinued in September, and normalization of white  blood cell count by today's visit likely points towards a serum as the main culprit in this case. Smoking is a common reason for leukocytosis as well, and patient continues to smoke, reportedly, 8 cigarettes per day. Patient does not have any evidence of ongoing inflammation or infection, which would explain the elevated white blood cell count. Chronic myelogenous leukemia is always a consideration in a young patient with elevated white blood cell count. So, we requested ESR and CRP to be performed, along with BCR-abl to rule out CML. Of note, Clozaril on rare occasions can cause neutropenia and agranulocytosis, so white blood cell count needs to be monitored closely. Diarrhea-is a good chance that diarrhea is caused by Linzess, so we recommended to stop it now and monitor his bowel movements. Patient will return to our clinic in 2 weeks to discuss the results of the workup.  Patient expressed understanding and was in agreement  with this plan. He also understands that He can call clinic at any time with any questions, concerns, or complaints.    No matching staging information was found for the patient.  Roxana Hires, MD   04/01/2015 1:56 PM

## 2015-04-01 NOTE — Telephone Encounter (Signed)
[facility]ed [facility] to let them know to stop linzess.  Dr. Gretel AcreBerenzon has spoke to pt and pt states he has several diarrhea stools a day and he is on linzess and when we spoke with the person who has family care home he states that we would need to call [facility] and let them know to stop linzess rx.  I called pharmacy and spoke to [place] and she took the order to d/c med.

## 2015-04-02 LAB — LEUKOCYTE ALKALINE PHOSPHATASE: LEUK ALK PHOS STAIN: 48 (ref 25–130)

## 2015-04-15 ENCOUNTER — Inpatient Hospital Stay: Payer: Medicaid Other | Attending: Internal Medicine

## 2015-04-15 ENCOUNTER — Ambulatory Visit: Payer: Self-pay

## 2016-03-28 ENCOUNTER — Emergency Department
Admission: EM | Admit: 2016-03-28 | Discharge: 2016-03-28 | Discharge status: Against Medical Advice | Payer: Medicaid Other

## 2017-04-13 IMAGING — US US LIVER ELASTOGRAPHY
1 series · 11 of 11 positions shown · non-contrast
Comparison: Right upper quadrant ultrasound dated 02/13/2015

CLINICAL DATA: Hepatitis C, evaluate for cirrhosis

EXAM:
ULTRASOUND HEPATIC ELASTOGRAPHY
TECHNIQUE: Ultrasound elastography evaluation of the liver was performed. A
region of interest was placed in the right lobe of the liver.
Following application of a compressive sonographic pulse, shear
waves were detected in the adjacent hepatic tissue and the shear
wave velocity was calculated. Multiple assessments were performed at
the selected site. Median shear wave velocity is correlated to a
Metavir fibrosis score.

[Series 1: us liver elastography · 0.20mm/px · 11 of 11 slices shown]
[im 1/11]
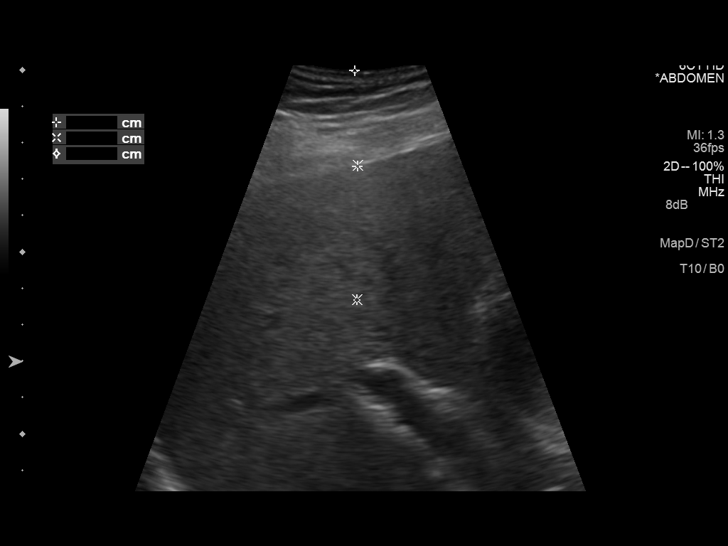
[im 2/11]
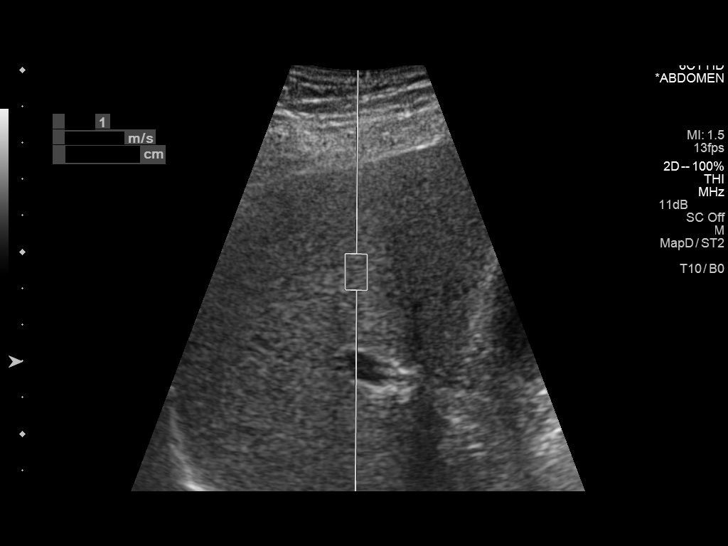
[im 3/11]
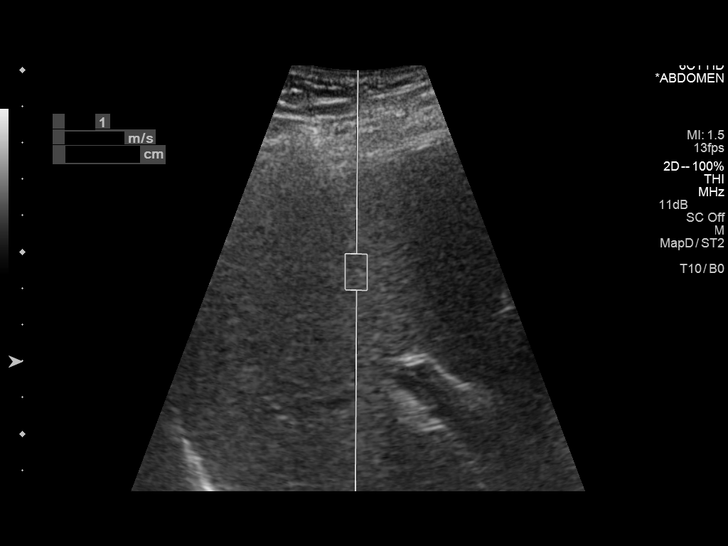
[im 4/11]
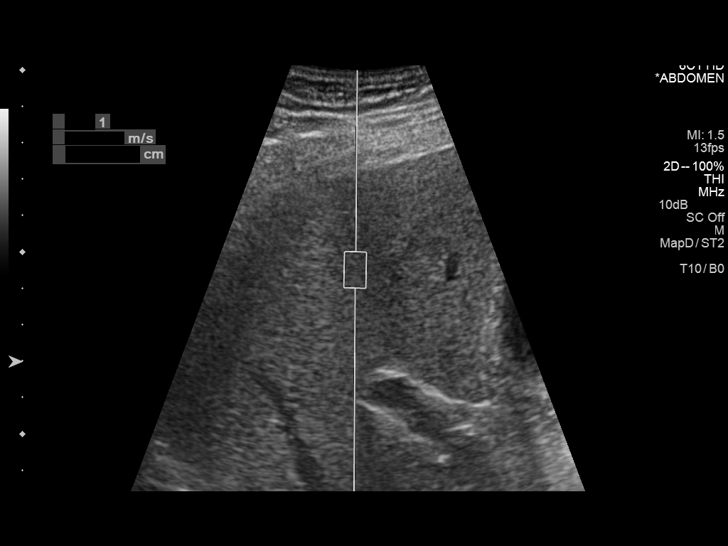
[im 5/11]
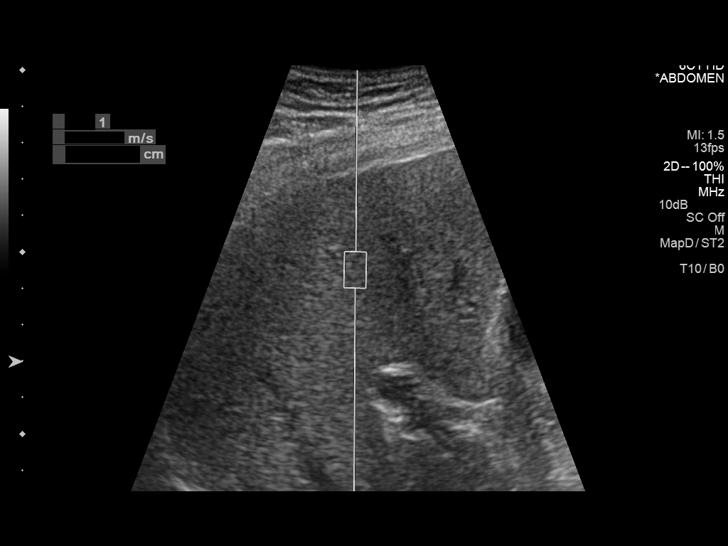
[im 6/11]
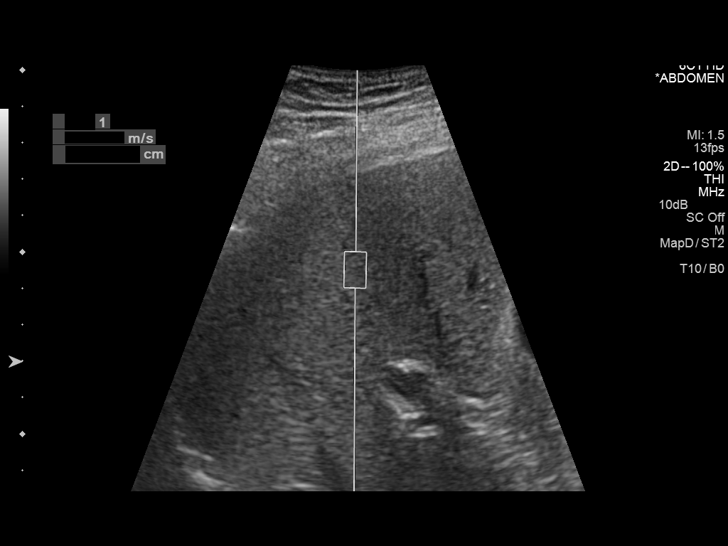
[im 7/11]
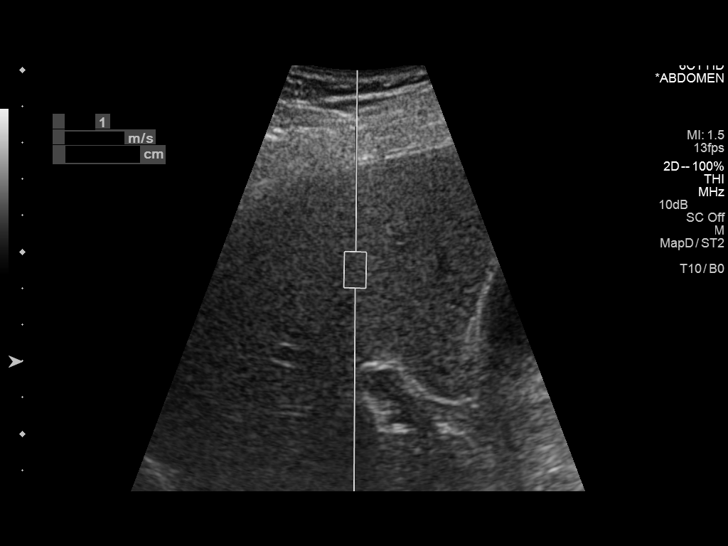
[im 8/11]
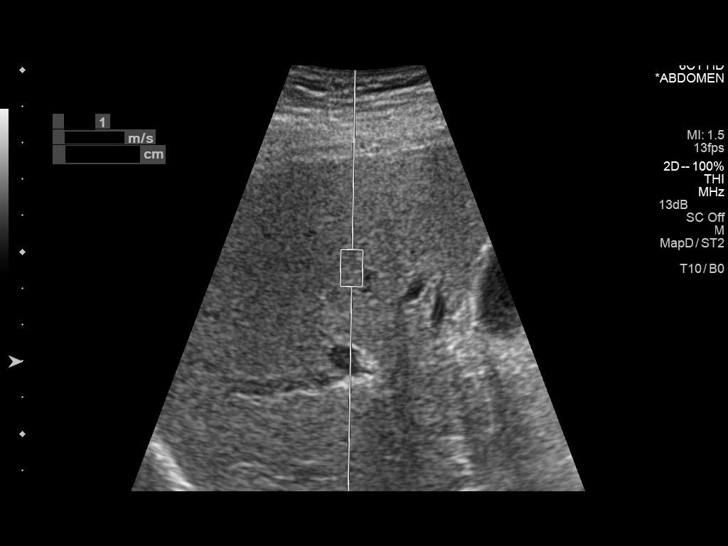
[im 9/11]
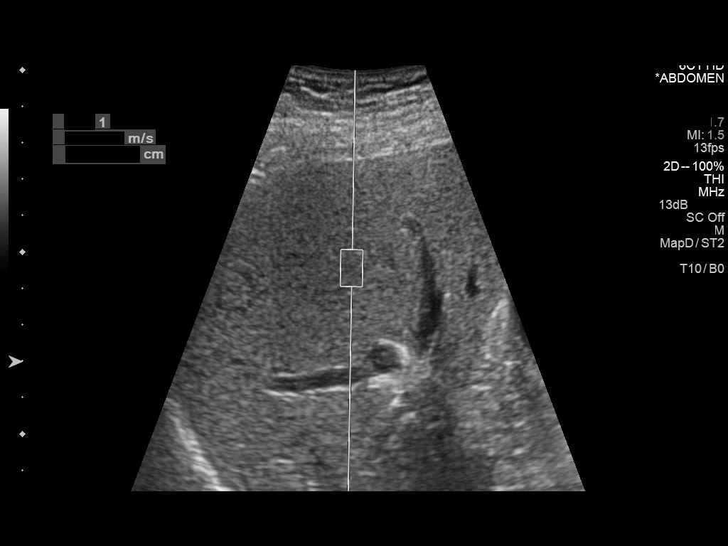
[im 10/11]
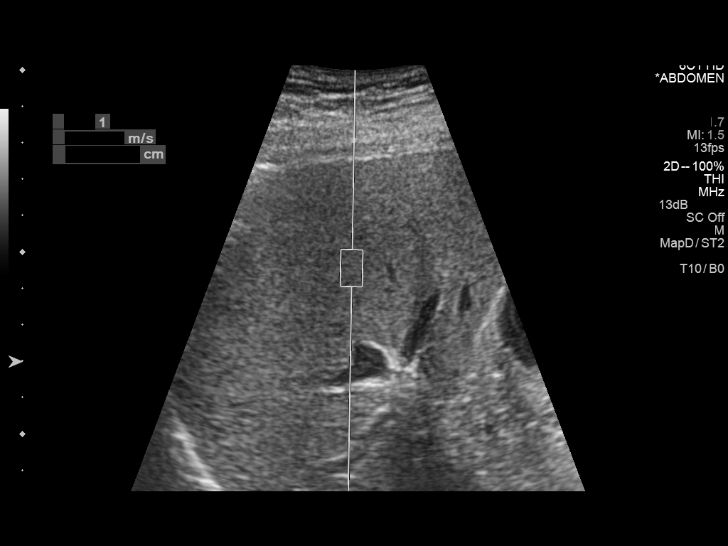
[im 11/11]
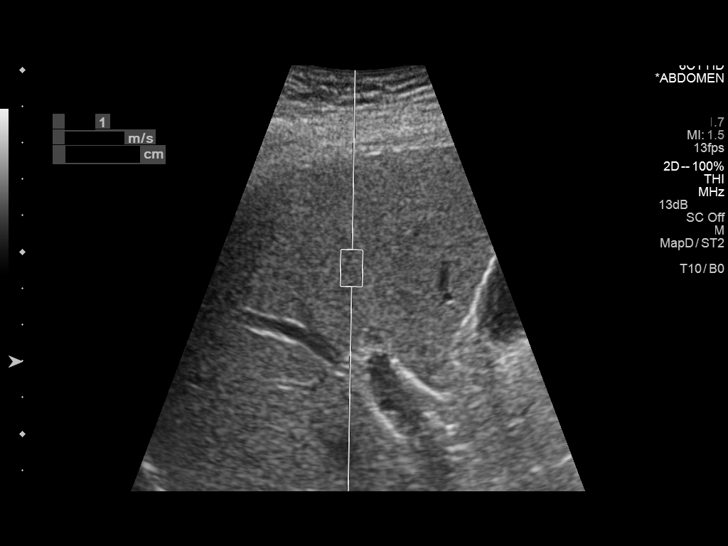

[11 of 11 positions shown; findings below may reference images not displayed]

FINDINGS: Device: Siemens Helix VTQ

Transducer 6C1

Patient position: Supine

Number of measurements: 10

Hepatic segment:  8

Median velocity:   2.50  m/sec

IQR:

IQR/Median velocity ratio:

Corresponding Metavir fibrosis score:  F3/F4

Risk of fibrosis: High

Limitations of exam: Respiratory motion.

Pertinent findings noted on other imaging exams:  None

Please note that abnormal shear wave velocities may also be
identified in clinical settings other than with hepatic fibrosis,
such as: acute hepatitis, elevated right heart and central venous
pressures including use of beta blockers, Karimi disease
([place]), infiltrative processes such as
mastocytosis/amyloidosis/infiltrative tumor, extrahepatic
cholestasis, in the post-prandial state, and liver transplantation.
Correlation with patient history, laboratory data, and clinical
condition recommended.
IMPRESSION: Median hepatic shear wave velocity is calculated at 2.50 m/sec.

Corresponding Metavir fibrosis score is  F3/F4.

Risk of fibrosis is high.

Follow-up: Follow-up advised.

## 2017-04-21 ENCOUNTER — Other Ambulatory Visit (HOSPITAL_COMMUNITY): Payer: Self-pay | Admitting: Gastroenterology

## 2017-04-21 DIAGNOSIS — B182 Chronic viral hepatitis C: Secondary | ICD-10-CM

## 2017-04-27 ENCOUNTER — Ambulatory Visit (HOSPITAL_COMMUNITY)
Admission: RE | Admit: 2017-04-27 | Discharge: 2017-04-27 | Disposition: A | Discharge status: Home / Self Care | Payer: Medicaid Other | Source: Ambulatory Visit | Attending: Gastroenterology | Admitting: Gastroenterology

## 2017-04-27 DIAGNOSIS — B182 Chronic viral hepatitis C: Secondary | ICD-10-CM | POA: Insufficient documentation

## 2017-12-30 IMAGING — US US ABDOMEN COMPLETE W/ ELASTOGRAPHY
2 series · 12 of 25 positions shown · non-contrast
Comparison: 03/13/2015 and 02/13/2015

CLINICAL DATA: Chronic hepatitis-C without hepatic coma.



[Series 1: us abdomen complete w/ elastography · 0.23mm/px · 11 of 98 slices shown (1 of 2)]
[im 5/98]
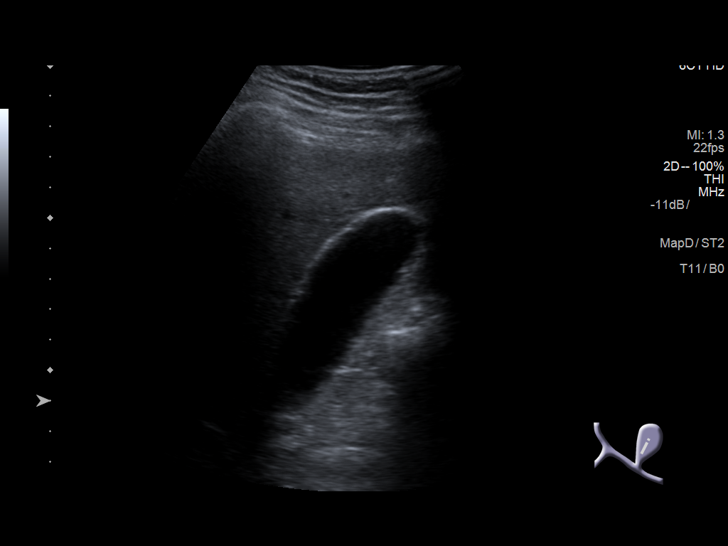
[im 14/98]
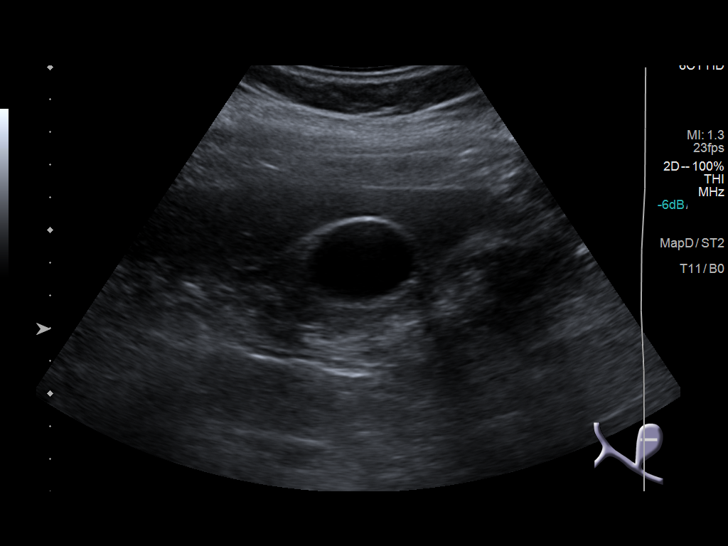
[im 24/98]
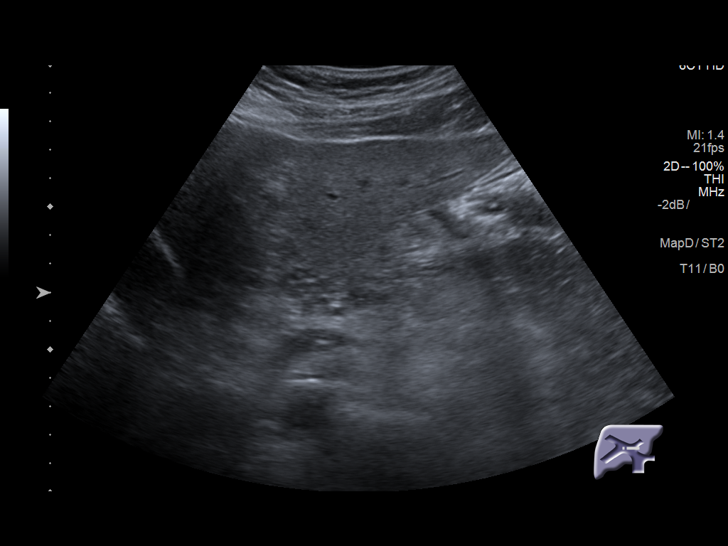
[im 33/98]
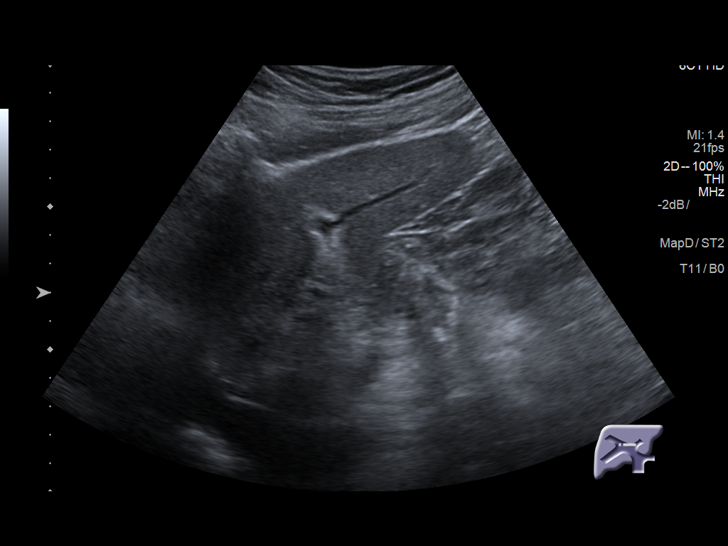
[im 42/98]
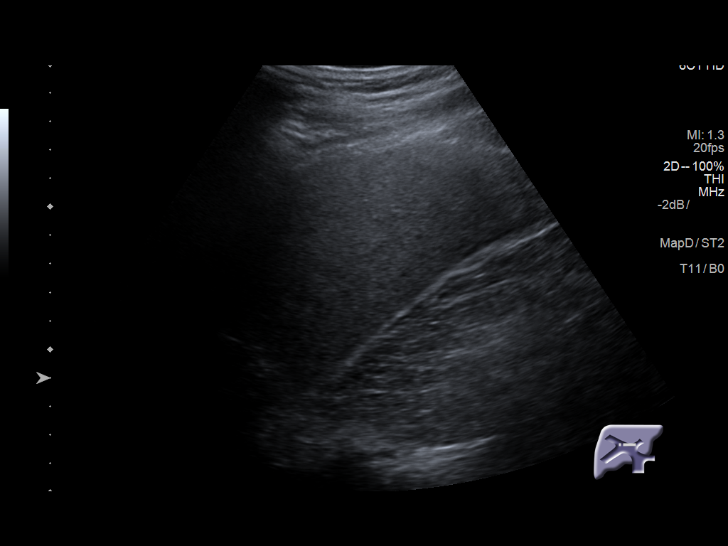
[im 51/98]
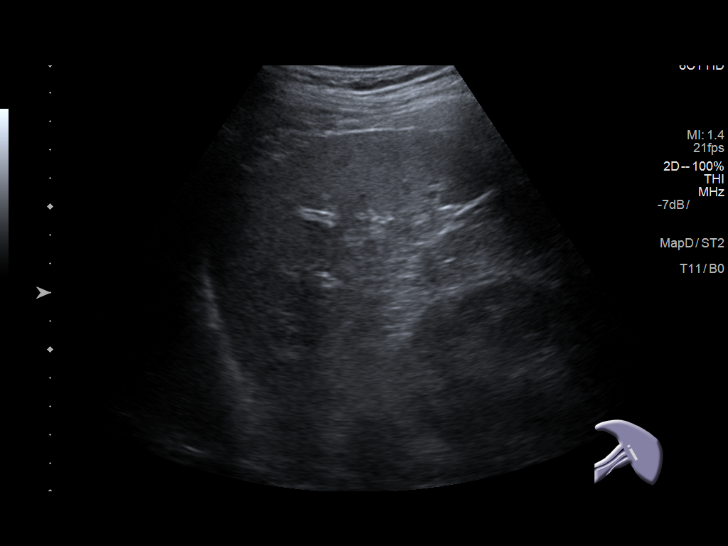
[im 61/98]
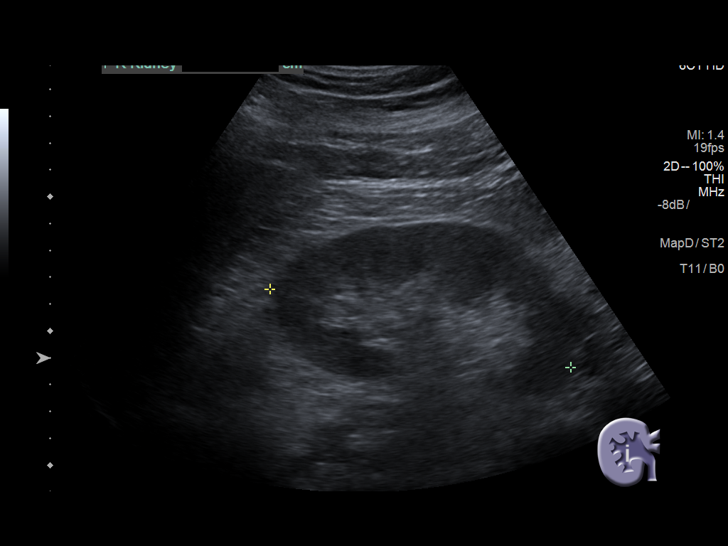
[im 70/98]
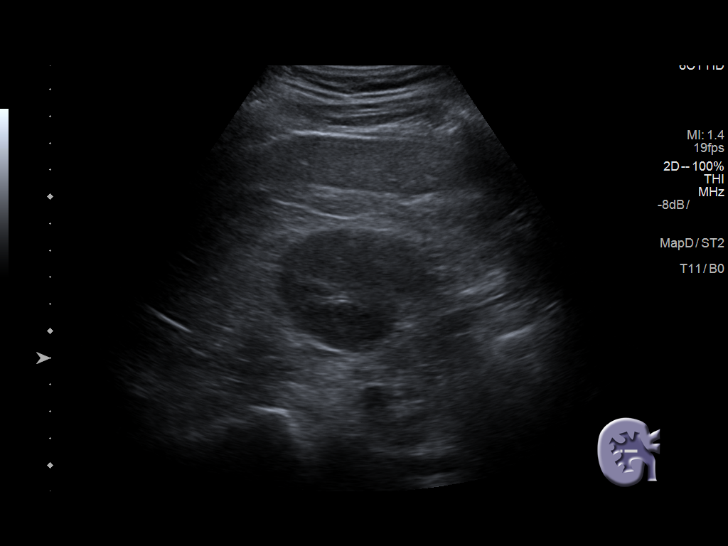
[im 79/98]
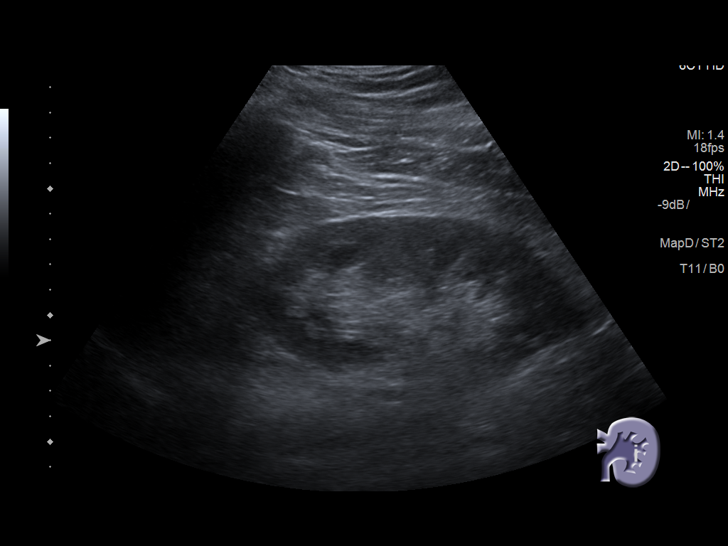
[im 88/98]
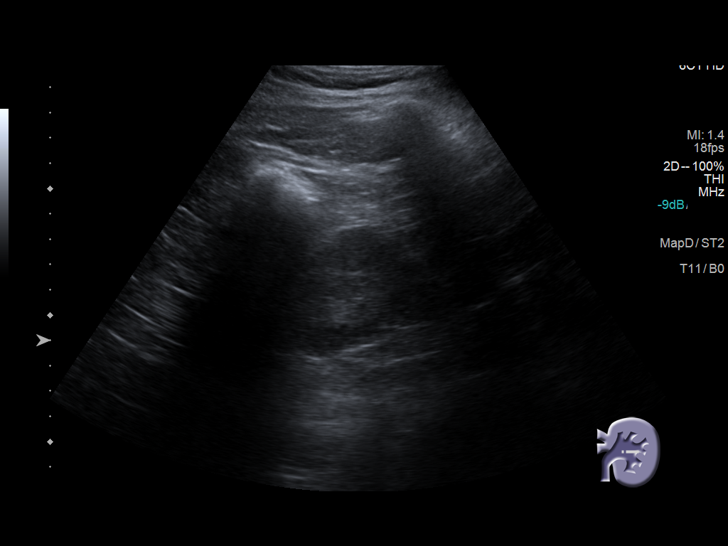
[im 98/98]
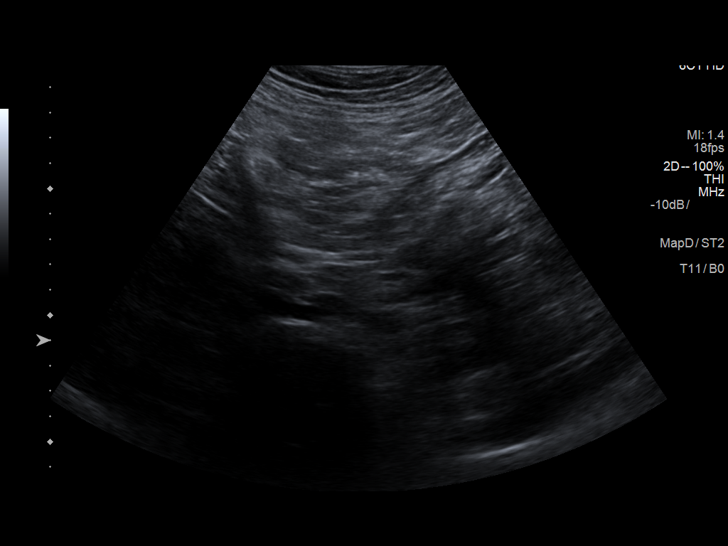

[Series 2001: us abdomen complete w/ elastography · 0.23mm/px · 1 of 12 slices shown (2 of 2)]
[im 6/12]
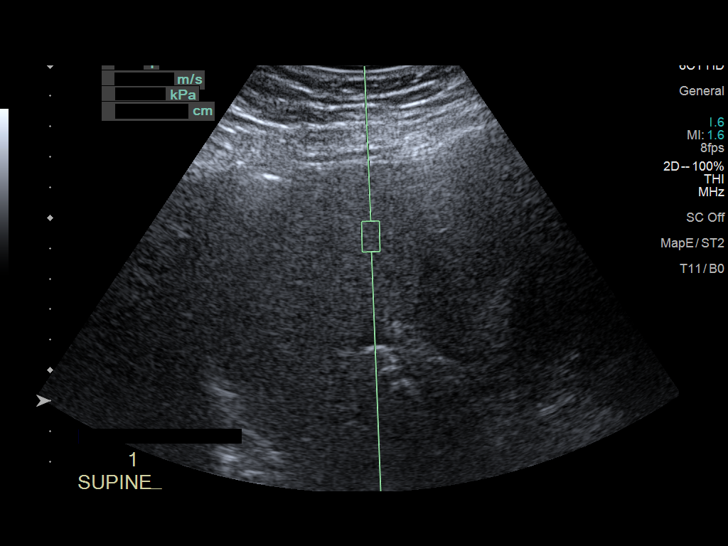

[12 of 25 positions shown; findings below may reference images not displayed]

FINDINGS: ULTRASOUND ABDOMEN

Gallbladder: No gallstones or wall thickening visualized. No
sonographic Murphy sign noted by sonographer.

Common bile duct: Diameter: 4 mm, within normal limits.

Liver: Mildly increased parenchymal echogenicity, suspicious for
diffuse hepatocellular disease. No liver mass identified. Portal
vein is patent on color Doppler imaging with normal direction of
blood flow towards the liver.

IVC: No abnormality visualized.

Pancreas: Not visualized due to overlying bowel gas.

Spleen: Size and appearance within normal limits.

Right Kidney: Length: 11.6 cm. Echogenicity within normal limits. No
mass or hydronephrosis visualized.

Left Kidney: Length: 11.6 cm. Echogenicity within normal limits. No
mass or hydronephrosis visualized.

Abdominal aorta: No aneurysm visualized.

Other findings: None.

ULTRASOUND HEPATIC ELASTOGRAPHY

Device: Siemens Helix VTQ

Patient position: Supine

Transducer 6C1

Number of measurements: 10

Hepatic segment:  8

Median velocity:   2.54  m/sec

IQR:

IQR/Median velocity ratio:

Corresponding Metavir fibrosis score:  Some F3 + F4

Risk of fibrosis: High

Limitations of exam: Patient breathing motion

Pertinent findings noted on other imaging exams:  None

Please note that abnormal shear wave velocities may also be
identified in clinical settings other than with hepatic fibrosis,
such as: acute hepatitis, elevated right heart and central venous
pressures including use of beta blockers, Jumper disease
(Agardi), infiltrative processes such as
mastocytosis/amyloidosis/infiltrative tumor, extrahepatic
cholestasis, in the post-prandial state, and liver transplantation.
Correlation with patient history, laboratory data, and clinical
condition recommended.
IMPRESSION: ULTRASOUND ABDOMEN:
Mildly increased hepatic parenchymal echogenicity, suspicious for
diffuse hepatocellular disease. No liver mass visualized.

Normal appearance of gallbladder. No evidence of biliary ductal
dilatation.

ULTRASOUND HEPATIC ELASTOGRAPHY:

Median hepatic shear wave velocity is calculated at 2.54 m/sec.

Corresponding Metavir fibrosis score is  Some F3 + F4.

Risk of fibrosis is High.

Follow-up: Follow up advised

## 2018-09-27 ENCOUNTER — Other Ambulatory Visit: Payer: Self-pay | Admitting: Gastroenterology

## 2018-09-27 DIAGNOSIS — B182 Chronic viral hepatitis C: Secondary | ICD-10-CM

## 2018-09-27 DIAGNOSIS — R634 Abnormal weight loss: Secondary | ICD-10-CM

## 2018-10-16 ENCOUNTER — Other Ambulatory Visit: Payer: Self-pay | Admitting: Gastroenterology

## 2018-10-16 ENCOUNTER — Other Ambulatory Visit (HOSPITAL_COMMUNITY): Payer: Self-pay | Admitting: Gastroenterology

## 2018-10-16 DIAGNOSIS — K760 Fatty (change of) liver, not elsewhere classified: Secondary | ICD-10-CM

## 2018-10-16 DIAGNOSIS — R634 Abnormal weight loss: Secondary | ICD-10-CM

## 2018-10-16 DIAGNOSIS — B182 Chronic viral hepatitis C: Secondary | ICD-10-CM

## 2018-10-16 DIAGNOSIS — K7400 Hepatic fibrosis, unspecified: Secondary | ICD-10-CM

## 2018-11-08 ENCOUNTER — Other Ambulatory Visit: Payer: Self-pay

## 2018-11-08 ENCOUNTER — Ambulatory Visit
Admission: RE | Admit: 2018-11-08 | Discharge: 2018-11-08 | Disposition: A | Discharge status: Home / Self Care | Payer: Medicaid Other | Source: Ambulatory Visit | Attending: Gastroenterology | Admitting: Gastroenterology

## 2018-11-08 DIAGNOSIS — R634 Abnormal weight loss: Secondary | ICD-10-CM | POA: Diagnosis present

## 2018-11-08 DIAGNOSIS — B182 Chronic viral hepatitis C: Secondary | ICD-10-CM | POA: Diagnosis present

## 2018-11-08 DIAGNOSIS — K7400 Hepatic fibrosis, unspecified: Secondary | ICD-10-CM

## 2018-11-08 DIAGNOSIS — K760 Fatty (change of) liver, not elsewhere classified: Secondary | ICD-10-CM | POA: Insufficient documentation

## 2020-12-09 IMAGING — US US ABDOMEN LIMITED W/ ELASTOGRAPHY
1 series · 13 of 25 positions shown · non-contrast
Comparison: 04/27/2017 abdominal sonogram. 03/13/2015 hepatic
elastography study.

CLINICAL DATA: Chronic hepatitis-C.

EXAM:
US ABDOMEN LIMITED - RIGHT UPPER QUADRANT
ULTRASOUND HEPATIC ELASTOGRAPHY
TECHNIQUE: Limited right upper quadrant abdominal ultrasound was performed. In
addition, ultrasound elastography evaluation of the liver was
performed. A region of interest was placed in the right lobe of the
liver. Following application of a compressive sonographic pulse,
shear waves were detected in the adjacent hepatic tissue and the
shear wave velocity was calculated. Multiple assessments were
performed at the selected site. Median shear wave velocity is
correlated to a Metavir fibrosis score.

[Series 1: us abdomen limited w/ elastography · 0.19mm/px · 13 of 74 slices shown]
[im 1/74]
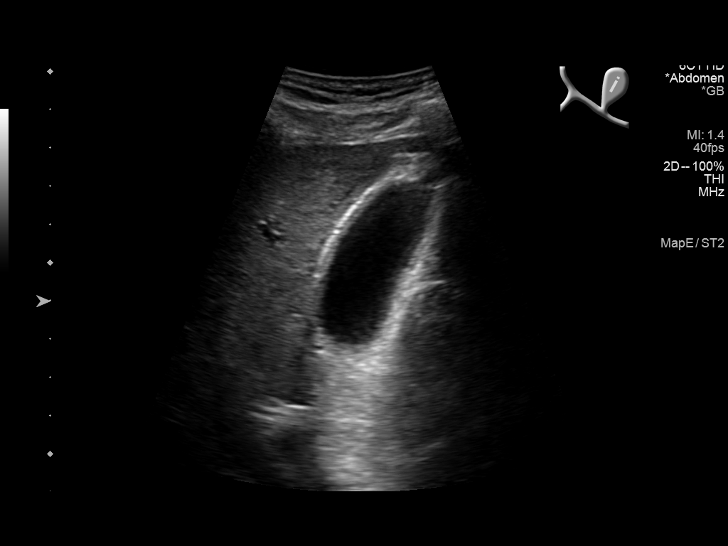
[im 7/74]
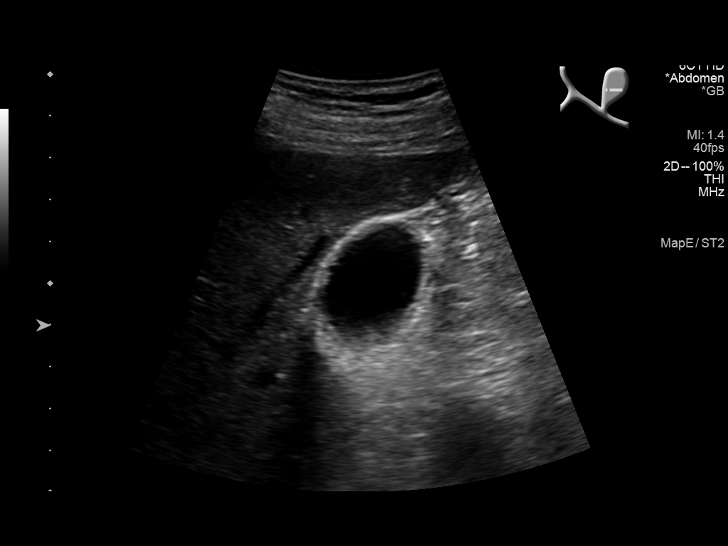
[im 13/74]
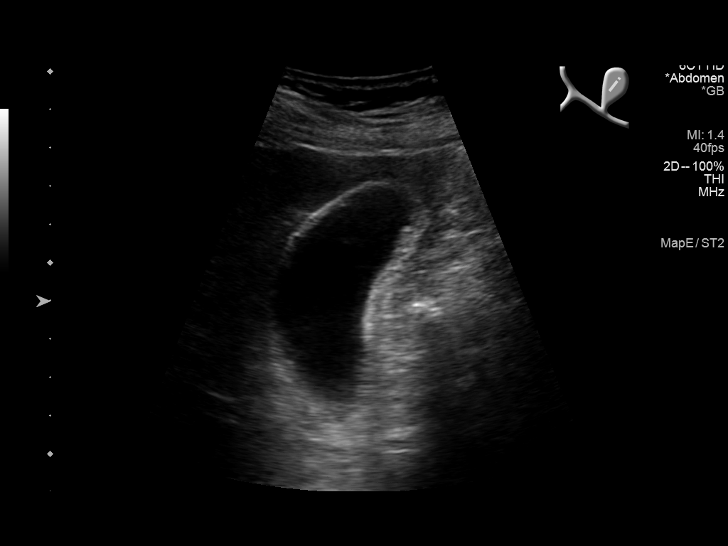
[im 19/74]
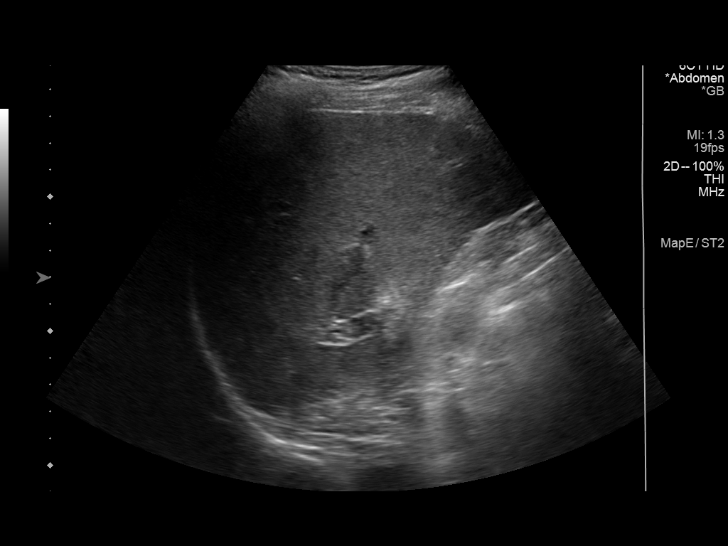
[im 25/74]
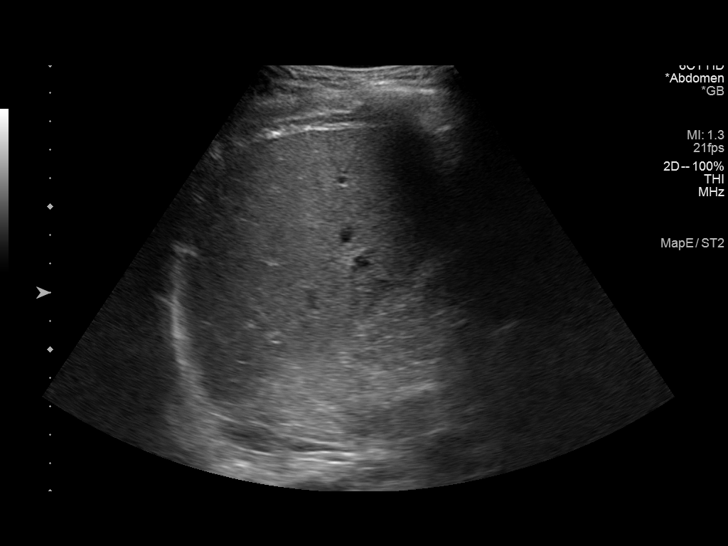
[im 31/74]
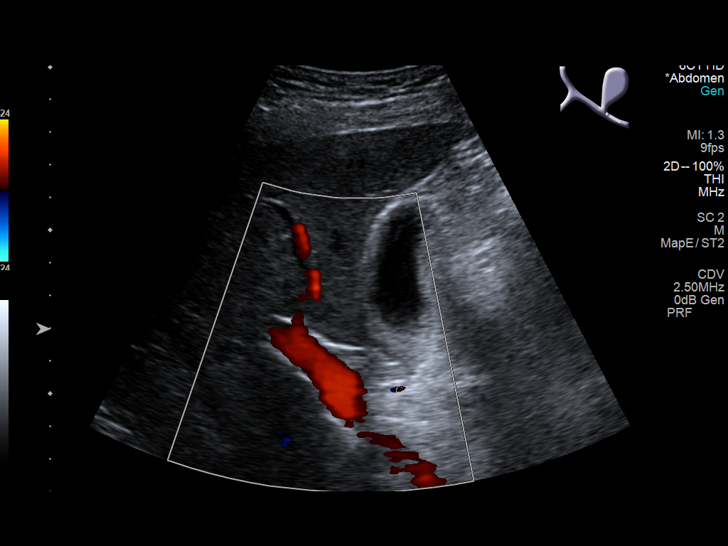
[im 37/74]
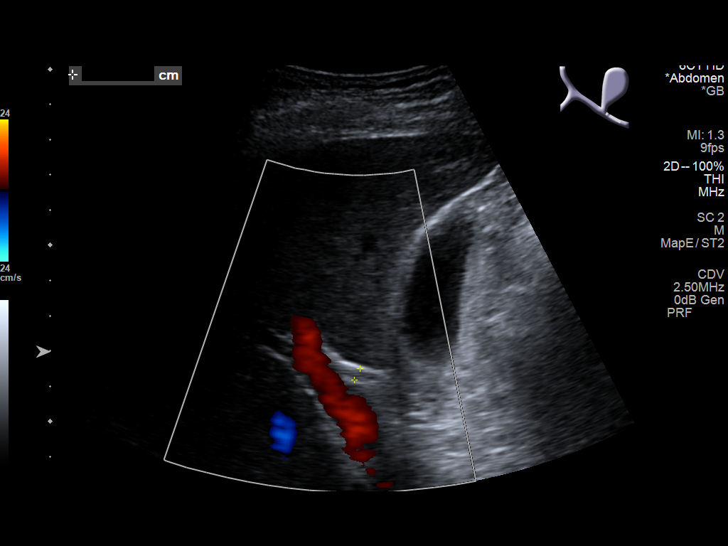
[im 43/74]
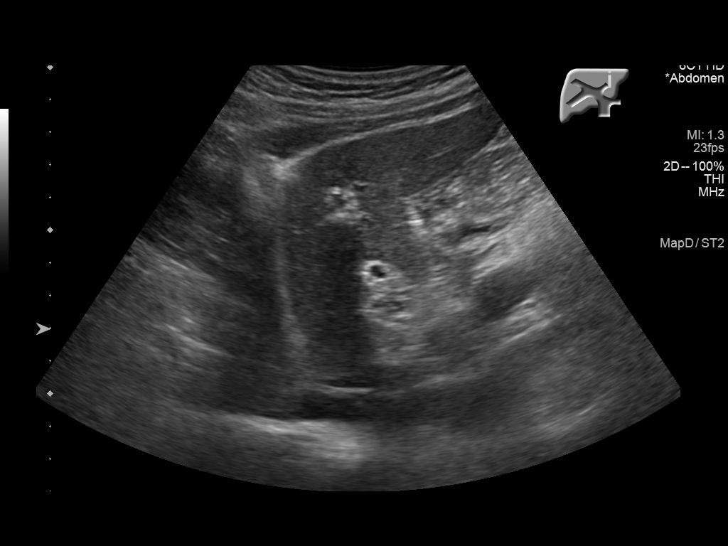
[im 49/74]
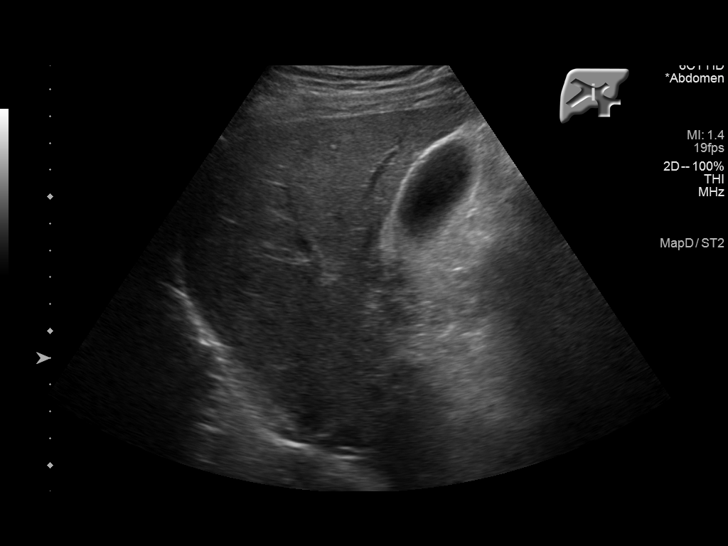
[im 55/74]
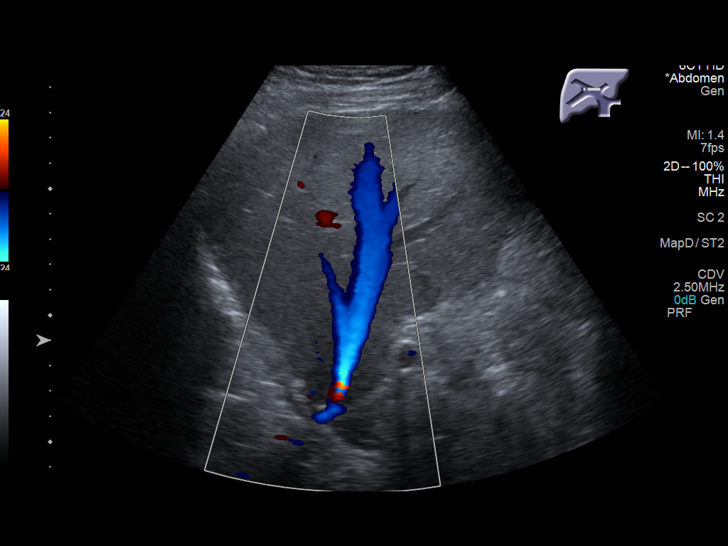
[im 61/74]
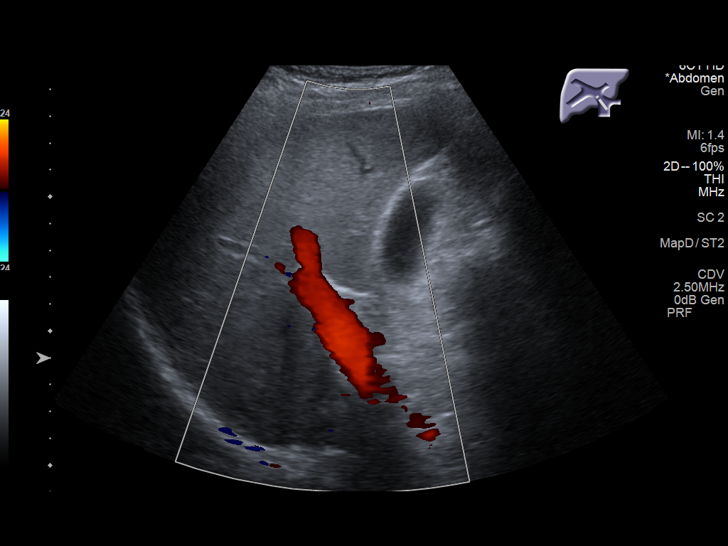
[im 67/74]
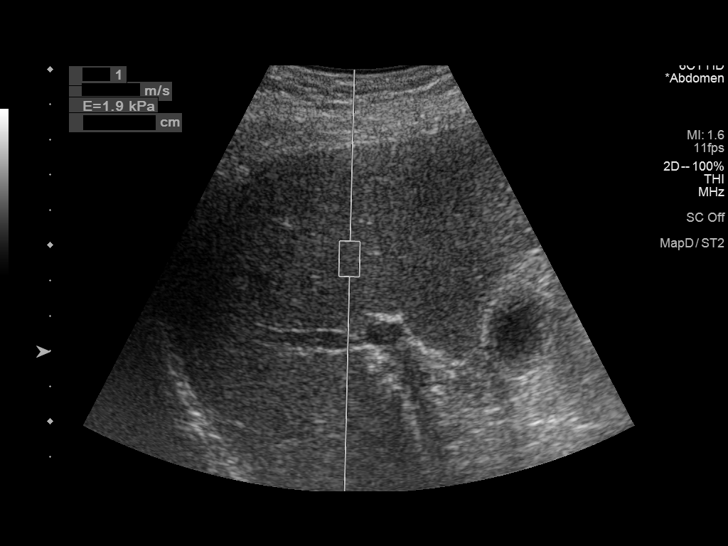
[im 74/74]
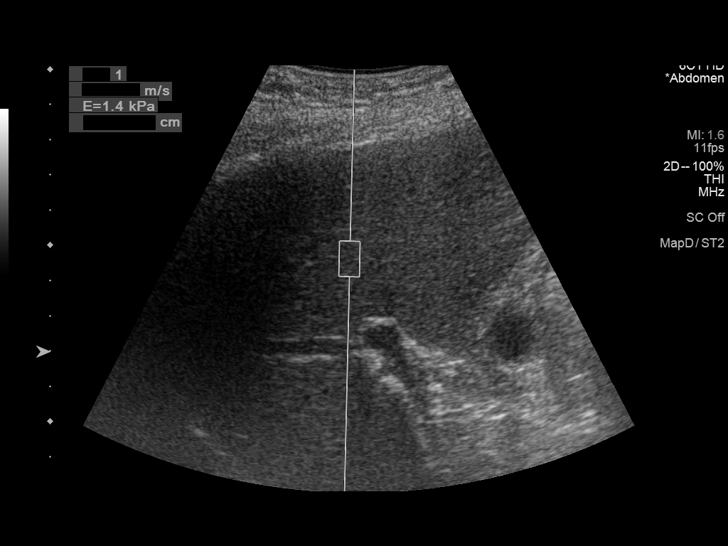

[13 of 25 positions shown; findings below may reference images not displayed]

FINDINGS: ULTRASOUND ABDOMEN LIMITED RIGHT UPPER QUADRANT

Gallbladder:

No gallstones or wall thickening visualized. No sonographic Murphy
sign noted.

Common bile duct:

Diameter: 4 mm

Liver:

No focal lesion identified. Within normal limits in parenchymal
echogenicity and echotexture. Portal vein is patent on color Doppler
imaging with normal direction of blood flow towards the liver.

ULTRASOUND HEPATIC ELASTOGRAPHY

Device: Siemens Helix VTQ

Patient position: Supine

Transducer 6C1

Number of measurements: 10

Hepatic segment:  8

Median velocity:   0.78 m/sec

IQR:

IQR/Median velocity ratio:

Corresponding Metavir fibrosis score:  F0/F1

Risk of fibrosis: Minimal

Limitations of exam: None

Please note that abnormal shear wave velocities may also be
identified in clinical settings other than with hepatic fibrosis,
such as: acute hepatitis, elevated right heart and central venous
pressures including use of beta blockers, Sibiro disease
([place]), infiltrative processes such as
mastocytosis/amyloidosis/infiltrative tumor, extrahepatic
cholestasis, in the post-prandial state, and liver transplantation.
Correlation with patient history, laboratory data, and clinical
condition recommended.
IMPRESSION: ULTRASOUND ABDOMEN: Normal right upper quadrant abdominal sonogram.
No liver masses.

ULTRASOUND HEPATIC ELASTOGRAHY:

Median hepatic shear wave velocity is calculated at 0.78 m/sec.

Corresponding Metavir fibrosis score is F0/F1.

Risk of fibrosis is Minimal.

Follow-up: None required.
